# Patient Record
Sex: Male | Born: 1950 | Race: White | Hispanic: No | Marital: Married | Smoking: Never smoker
Health system: Southern US, Community
[De-identification: ages and names within clinical notes are randomized; demographics above are authoritative.]

## PROBLEM LIST (undated history)

## (undated) DIAGNOSIS — I10 Essential (primary) hypertension: Secondary | ICD-10-CM

## (undated) HISTORY — PX: NECK SURGERY: SHX720

---

## 2016-02-13 ENCOUNTER — Ambulatory Visit: Payer: Medicare Other | Admitting: Occupational Therapy

## 2016-02-13 NOTE — Therapy (Signed)
[facility] [address] [place], [place], [address] Phone: [phone]   Fax:  [phone]  Occupational Therapy Treatment  Patient Details  Name: Clement SayresJoseph Khouri Sr. MRN: [id number] Date of Birth: 01/02/1951 No Data Recorded  Encounter Date: 02/13/2016    No past medical history on file.  No past surgical history on file.  There were no vitals filed for this visit.     Pt report having about 10 months ago C6, C7 and T1 cervical surgery - and cont to have issues with 4th and 5th digits fatiguing and not able to extend them fully  - worse as the day go  Normal sensation - pt in need for night time splint to keep digit 5th and 4th fully extended  Didfabricate MC block splint for 5th to be able to extend PIP more with greeting people or reaching hand in narrow spaces  Ed on wearing and precautions -verbalize understaning   Pt did not had MD order - said his insurance do not require Therapist, nutritional- secretary did say several time that hospital policy do require - pt was setup as screen   Spend 50 min with pt                                  Patient will benefit from skilled therapeutic intervention in order to improve the following deficits and impairments:     Visit Diagnosis: Stiffness of right hand, not elsewhere classified    Problem List There are no active problems to display for this patient.   Oletta CohnuPreez, Nance Mccombs OTR/L,CLT  02/13/2016, 7:17 PM  [facility] [address] [place], [place], [address] Phone: [phone]   Fax:  [phone]  Name: Clement SayresJoseph Klemann Sr. MRN: [id number] Date of Birth: 02/02/1951

## 2018-11-29 ENCOUNTER — Emergency Department: Payer: Medicare Other

## 2018-11-29 ENCOUNTER — Inpatient Hospital Stay
Admission: EM | Admit: 2018-11-29 | Discharge: 2018-11-30 | DRG: 287 | Disposition: A | Discharge status: Other Acute Inpatient Hospital | Payer: Medicare Other | Attending: Internal Medicine | Admitting: Internal Medicine

## 2018-11-29 ENCOUNTER — Encounter: Payer: Self-pay | Admitting: Internal Medicine

## 2018-11-29 ENCOUNTER — Encounter
Admission: EM | Disposition: A | Discharge status: Other Acute Inpatient Hospital | Payer: Self-pay | Source: Home / Self Care | Attending: Internal Medicine

## 2018-11-29 ENCOUNTER — Inpatient Hospital Stay: Payer: Medicare Other

## 2018-11-29 ENCOUNTER — Other Ambulatory Visit: Payer: Self-pay

## 2018-11-29 ENCOUNTER — Inpatient Hospital Stay
Admit: 2018-11-29 | Discharge: 2018-11-29 | Disposition: A | Discharge status: Home / Self Care | Payer: Medicare Other | Attending: Internal Medicine | Admitting: Internal Medicine

## 2018-11-29 DIAGNOSIS — R001 Bradycardia, unspecified: Secondary | ICD-10-CM | POA: Diagnosis not present

## 2018-11-29 DIAGNOSIS — I10 Essential (primary) hypertension: Secondary | ICD-10-CM | POA: Diagnosis present

## 2018-11-29 DIAGNOSIS — I472 Ventricular tachycardia, unspecified: Secondary | ICD-10-CM

## 2018-11-29 DIAGNOSIS — R739 Hyperglycemia, unspecified: Secondary | ICD-10-CM | POA: Diagnosis present

## 2018-11-29 DIAGNOSIS — Z880 Allergy status to penicillin: Secondary | ICD-10-CM | POA: Diagnosis not present

## 2018-11-29 DIAGNOSIS — I213 ST elevation (STEMI) myocardial infarction of unspecified site: Secondary | ICD-10-CM | POA: Diagnosis present

## 2018-11-29 DIAGNOSIS — M109 Gout, unspecified: Secondary | ICD-10-CM | POA: Diagnosis present

## 2018-11-29 DIAGNOSIS — E860 Dehydration: Secondary | ICD-10-CM | POA: Diagnosis present

## 2018-11-29 DIAGNOSIS — G4733 Obstructive sleep apnea (adult) (pediatric): Secondary | ICD-10-CM | POA: Diagnosis present

## 2018-11-29 DIAGNOSIS — R55 Syncope and collapse: Secondary | ICD-10-CM | POA: Diagnosis present

## 2018-11-29 DIAGNOSIS — R7989 Other specified abnormal findings of blood chemistry: Secondary | ICD-10-CM | POA: Diagnosis present

## 2018-11-29 DIAGNOSIS — J4530 Mild persistent asthma, uncomplicated: Secondary | ICD-10-CM | POA: Diagnosis present

## 2018-11-29 DIAGNOSIS — Z7951 Long term (current) use of inhaled steroids: Secondary | ICD-10-CM

## 2018-11-29 DIAGNOSIS — Z79899 Other long term (current) drug therapy: Secondary | ICD-10-CM | POA: Diagnosis not present

## 2018-11-29 HISTORY — DX: Essential (primary) hypertension: I10

## 2018-11-29 HISTORY — PX: LEFT HEART CATH AND CORONARY ANGIOGRAPHY: CATH118249

## 2018-11-29 LAB — COMPREHENSIVE METABOLIC PANEL
ALT: 19 U/L (ref 0–44)
AST: 29 U/L (ref 15–41)
Albumin: 3.8 g/dL (ref 3.5–5.0)
Alkaline Phosphatase: 73 U/L (ref 38–126)
Anion gap: 15 (ref 5–15)
BUN: 24 mg/dL — ABNORMAL HIGH (ref 8–23)
CO2: 17 mmol/L — ABNORMAL LOW (ref 22–32)
Calcium: 9 mg/dL (ref 8.9–10.3)
Chloride: 106 mmol/L (ref 98–111)
Creatinine, Ser: 1.63 mg/dL — ABNORMAL HIGH (ref 0.61–1.24)
GFR calc Af Amer: 49 mL/min — ABNORMAL LOW (ref >60)
GFR calc non Af Amer: 42 mL/min — ABNORMAL LOW (ref >60)
Glucose, Bld: 276 mg/dL — ABNORMAL HIGH (ref 70–99)
Potassium: 4.2 mmol/L (ref 3.5–5.1)
Sodium: 138 mmol/L (ref 135–145)
Total Bilirubin: 0.9 mg/dL (ref 0.3–1.2)
Total Protein: 7.2 g/dL (ref 6.5–8.1)

## 2018-11-29 LAB — MRSA PCR SCREENING: MRSA by PCR: NEGATIVE

## 2018-11-29 LAB — CBC WITH DIFFERENTIAL/PLATELET
Abs Immature Granulocytes: 0.12 10*3/uL — ABNORMAL HIGH (ref 0.00–0.07)
Basophils Absolute: 0.2 10*3/uL — ABNORMAL HIGH (ref 0.0–0.1)
Basophils Relative: 1 %
Eosinophils Absolute: 0.7 10*3/uL — ABNORMAL HIGH (ref 0.0–0.5)
Eosinophils Relative: 4 %
HCT: 41.4 % (ref 39.0–52.0)
Hemoglobin: 13.6 g/dL (ref 13.0–17.0)
Immature Granulocytes: 1 %
Lymphocytes Relative: 24 %
Lymphs Abs: 4.3 10*3/uL — ABNORMAL HIGH (ref 0.7–4.0)
MCH: 30.9 pg (ref 26.0–34.0)
MCHC: 32.9 g/dL (ref 30.0–36.0)
MCV: 94.1 fL (ref 80.0–100.0)
Monocytes Absolute: 1.2 10*3/uL — ABNORMAL HIGH (ref 0.1–1.0)
Monocytes Relative: 6 %
Neutro Abs: 11.7 10*3/uL — ABNORMAL HIGH (ref 1.7–7.7)
Neutrophils Relative %: 64 %
Platelets: 330 10*3/uL (ref 150–400)
RBC: 4.4 MIL/uL (ref 4.22–5.81)
RDW: 14.4 % (ref 11.5–15.5)
WBC: 18.2 10*3/uL — ABNORMAL HIGH (ref 4.0–10.5)
nRBC: 0 % (ref 0.0–0.2)

## 2018-11-29 LAB — MAGNESIUM: Magnesium: 1.5 mg/dL — ABNORMAL LOW (ref 1.7–2.4)

## 2018-11-29 LAB — TROPONIN I
Troponin I: <0.03 ng/mL (ref <0.03)
Troponin I: 0.68 ng/mL (ref <0.03)
Troponin I: 10.33 ng/mL (ref <0.03)

## 2018-11-29 LAB — LIPASE, BLOOD: Lipase: 37 U/L (ref 11–51)

## 2018-11-29 LAB — APTT: aPTT: 65 seconds — ABNORMAL HIGH (ref 24–36)

## 2018-11-29 LAB — T4, FREE: Free T4: 0.93 ng/dL (ref 0.82–1.77)

## 2018-11-29 LAB — GLUCOSE, CAPILLARY: Glucose-Capillary: 136 mg/dL — ABNORMAL HIGH (ref 70–99)

## 2018-11-29 LAB — ETHANOL: Alcohol, Ethyl (B): <10 mg/dL (ref <10)

## 2018-11-29 LAB — PROTIME-INR
INR: 1.1 (ref 0.8–1.2)
Prothrombin Time: 14.3 seconds (ref 11.4–15.2)

## 2018-11-29 LAB — TSH: TSH: 5.014 u[IU]/mL — ABNORMAL HIGH (ref 0.350–4.500)

## 2018-11-29 SURGERY — LEFT HEART CATH AND CORONARY ANGIOGRAPHY
Anesthesia: Moderate Sedation

## 2018-11-29 MED ORDER — AMIODARONE HCL IN DEXTROSE 360-4.14 MG/200ML-% IV SOLN: 60.0000 mg/h | INTRAVENOUS | Status: DC

## 2018-11-29 MED ORDER — HYDROCODONE-ACETAMINOPHEN 5-325 MG PO TABS: 1.0000 | ORAL_TABLET | ORAL | Status: DC | PRN

## 2018-11-29 MED ORDER — FENTANYL CITRATE (PF) 100 MCG/2ML IJ SOLN
INTRAMUSCULAR | Status: AC
  Filled 2018-11-29: qty 2

## 2018-11-29 MED ORDER — ATROPINE SULFATE 1 MG/10ML IJ SOSY
0.5000 mg | PREFILLED_SYRINGE | Freq: Once | INTRAMUSCULAR | Status: AC
  Administered 2018-11-29: 0.5 mg via INTRAVENOUS

## 2018-11-29 MED ORDER — AMIODARONE HCL IN DEXTROSE 360-4.14 MG/200ML-% IV SOLN
60.0000 mg/h | INTRAVENOUS | Status: DC
  Administered 2018-11-29: 60 mg/h via INTRAVENOUS

## 2018-11-29 MED ORDER — HEPARIN (PORCINE) IN NACL 1000-0.9 UT/500ML-% IV SOLN
INTRAVENOUS | Status: DC | PRN
  Administered 2018-11-29: 1000 mL

## 2018-11-29 MED ORDER — ASPIRIN 81 MG PO CHEW
81.0000 mg | CHEWABLE_TABLET | Freq: Every day | ORAL | Status: DC
  Administered 2018-11-30: 81 mg via ORAL
  Filled 2018-11-29: qty 1

## 2018-11-29 MED ORDER — ONDANSETRON HCL 4 MG/2ML IJ SOLN: 4.0000 mg | Freq: Four times a day (QID) | INTRAMUSCULAR | Status: DC | PRN

## 2018-11-29 MED ORDER — HYDRALAZINE HCL 20 MG/ML IJ SOLN: 10.0000 mg | INTRAMUSCULAR | Status: DC | PRN

## 2018-11-29 MED ORDER — ACETAMINOPHEN 325 MG PO TABS: 650.0000 mg | ORAL_TABLET | Freq: Four times a day (QID) | ORAL | Status: DC | PRN

## 2018-11-29 MED ORDER — ALLOPURINOL 100 MG PO TABS
100.0000 mg | ORAL_TABLET | Freq: Every day | ORAL | Status: DC
  Filled 2018-11-29: qty 1

## 2018-11-29 MED ORDER — AMIODARONE LOAD VIA INFUSION
150.0000 mg | Freq: Once | INTRAVENOUS | Status: AC
  Administered 2018-11-29: 15:00:00 150 mg via INTRAVENOUS
  Filled 2018-11-29: qty 83.34

## 2018-11-29 MED ORDER — GABAPENTIN 300 MG PO CAPS
300.0000 mg | ORAL_CAPSULE | Freq: Two times a day (BID) | ORAL | Status: DC
  Administered 2018-11-29 – 2018-11-30 (×2): 300 mg via ORAL
  Filled 2018-11-29 (×2): qty 1

## 2018-11-29 MED ORDER — HYDROCHLOROTHIAZIDE 12.5 MG PO CAPS
12.5000 mg | ORAL_CAPSULE | Freq: Every day | ORAL | Status: DC
  Administered 2018-11-30: 12.5 mg via ORAL
  Filled 2018-11-29: qty 1

## 2018-11-29 MED ORDER — MAGNESIUM SULFATE 2 GM/50ML IV SOLN
2.0000 g | Freq: Once | INTRAVENOUS | Status: AC
  Administered 2018-11-29: 2 g via INTRAVENOUS
  Filled 2018-11-29: qty 50

## 2018-11-29 MED ORDER — TICAGRELOR 90 MG PO TABS
180.0000 mg | ORAL_TABLET | Freq: Once | ORAL | Status: DC
  Filled 2018-11-29: qty 2

## 2018-11-29 MED ORDER — AMIODARONE HCL IN DEXTROSE 360-4.14 MG/200ML-% IV SOLN
INTRAVENOUS | Status: AC
  Filled 2018-11-29: qty 200

## 2018-11-29 MED ORDER — SODIUM CHLORIDE 0.9 % IV SOLN: INTRAVENOUS | Status: DC

## 2018-11-29 MED ORDER — ACETAMINOPHEN 650 MG RE SUPP: 650.0000 mg | Freq: Four times a day (QID) | RECTAL | Status: DC | PRN

## 2018-11-29 MED ORDER — HEPARIN (PORCINE) 25000 UT/250ML-% IV SOLN: 1500.0000 [IU]/h | INTRAVENOUS | Status: DC

## 2018-11-29 MED ORDER — ASPIRIN 81 MG PO CHEW
CHEWABLE_TABLET | ORAL | Status: AC
  Filled 2018-11-29: qty 4

## 2018-11-29 MED ORDER — ATORVASTATIN CALCIUM 10 MG PO TABS
10.0000 mg | ORAL_TABLET | Freq: Every day | ORAL | Status: DC
  Administered 2018-11-30: 10:00:00 10 mg via ORAL
  Filled 2018-11-29 (×2): qty 1

## 2018-11-29 MED ORDER — ASPIRIN 81 MG PO CHEW
324.0000 mg | CHEWABLE_TABLET | Freq: Once | ORAL | Status: AC
  Administered 2018-11-29: 324 mg via ORAL

## 2018-11-29 MED ORDER — ACETAMINOPHEN 325 MG PO TABS: 650.0000 mg | ORAL_TABLET | ORAL | Status: DC | PRN

## 2018-11-29 MED ORDER — PERFLUTREN LIPID MICROSPHERE
1.0000 mL | INTRAVENOUS | Status: AC | PRN
  Administered 2018-11-29: 19:00:00 2 mL via INTRAVENOUS
  Filled 2018-11-29: qty 10

## 2018-11-29 MED ORDER — AMIODARONE HCL IN DEXTROSE 360-4.14 MG/200ML-% IV SOLN: 30.0000 mg/h | INTRAVENOUS | Status: DC

## 2018-11-29 MED ORDER — AMIODARONE IV BOLUS ONLY 150 MG/100ML
INTRAVENOUS | Status: AC
  Filled 2018-11-29: qty 100

## 2018-11-29 MED ORDER — LORAZEPAM 2 MG/ML IJ SOLN
2.0000 mg | Freq: Once | INTRAMUSCULAR | Status: AC
  Administered 2018-11-29: 2 mg via INTRAVENOUS

## 2018-11-29 MED ORDER — SODIUM CHLORIDE 0.9 % WEIGHT BASED INFUSION: 1.0000 mL/kg/h | INTRAVENOUS | Status: DC

## 2018-11-29 MED ORDER — SODIUM CHLORIDE 0.9 % IV SOLN
INTRAVENOUS | Status: AC | PRN
  Administered 2018-11-29: 250 mL via INTRAVENOUS

## 2018-11-29 MED ORDER — SENNOSIDES-DOCUSATE SODIUM 8.6-50 MG PO TABS: 1.0000 | ORAL_TABLET | Freq: Every evening | ORAL | Status: DC | PRN

## 2018-11-29 MED ORDER — LORAZEPAM 2 MG/ML IJ SOLN
INTRAMUSCULAR | Status: AC
  Filled 2018-11-29: qty 1

## 2018-11-29 MED ORDER — SODIUM CHLORIDE 0.9% FLUSH
3.0000 mL | Freq: Two times a day (BID) | INTRAVENOUS | Status: DC
  Administered 2018-11-30: 3 mL via INTRAVENOUS

## 2018-11-29 MED ORDER — LISINOPRIL-HYDROCHLOROTHIAZIDE 20-12.5 MG PO TABS: 1.0000 | ORAL_TABLET | Freq: Every day | ORAL | Status: DC

## 2018-11-29 MED ORDER — SODIUM CHLORIDE 0.9% FLUSH: 3.0000 mL | INTRAVENOUS | Status: DC | PRN

## 2018-11-29 MED ORDER — ALLOPURINOL 300 MG PO TABS: 300.0000 mg | ORAL_TABLET | Freq: Every day | ORAL | Status: DC

## 2018-11-29 MED ORDER — ONDANSETRON HCL 4 MG PO TABS: 4.0000 mg | ORAL_TABLET | Freq: Four times a day (QID) | ORAL | Status: DC | PRN

## 2018-11-29 MED ORDER — COLCHICINE 0.6 MG PO TABS
0.6000 mg | ORAL_TABLET | ORAL | Status: DC | PRN
  Filled 2018-11-29: qty 2

## 2018-11-29 MED ORDER — HEPARIN SODIUM (PORCINE) 5000 UNIT/ML IJ SOLN
4000.0000 [IU] | Freq: Once | INTRAMUSCULAR | Status: AC
  Administered 2018-11-29: 4000 [IU] via INTRAVENOUS
  Filled 2018-11-29: qty 1

## 2018-11-29 MED ORDER — MORPHINE SULFATE (PF) 2 MG/ML IV SOLN: 2.0000 mg | INTRAVENOUS | Status: DC | PRN

## 2018-11-29 MED ORDER — METOPROLOL TARTRATE 25 MG PO TABS
25.0000 mg | ORAL_TABLET | Freq: Two times a day (BID) | ORAL | Status: DC
  Administered 2018-11-29 – 2018-11-30 (×2): 25 mg via ORAL
  Filled 2018-11-29 (×2): qty 1

## 2018-11-29 MED ORDER — LABETALOL HCL 5 MG/ML IV SOLN: 10.0000 mg | INTRAVENOUS | Status: DC | PRN

## 2018-11-29 MED ORDER — ALLOPURINOL 300 MG PO TABS
300.0000 mg | ORAL_TABLET | Freq: Every day | ORAL | Status: DC
  Administered 2018-11-30: 10:00:00 300 mg via ORAL
  Filled 2018-11-29: qty 1

## 2018-11-29 MED ORDER — ENOXAPARIN SODIUM 40 MG/0.4ML ~~LOC~~ SOLN
40.0000 mg | SUBCUTANEOUS | Status: DC
  Administered 2018-11-29: 40 mg via SUBCUTANEOUS
  Filled 2018-11-29: qty 0.4

## 2018-11-29 MED ORDER — LISINOPRIL 20 MG PO TABS
20.0000 mg | ORAL_TABLET | Freq: Every day | ORAL | Status: DC
  Administered 2018-11-30: 20 mg via ORAL
  Filled 2018-11-29: qty 1

## 2018-11-29 MED ORDER — SODIUM CHLORIDE 0.9 % IV SOLN: 250.0000 mL | INTRAVENOUS | Status: DC | PRN

## 2018-11-29 SURGICAL SUPPLY — 11 items
CATH INFINITI 5FR ANG PIGTAIL (CATHETERS) ×2 IMPLANT
CATH INFINITI 5FR JL4 (CATHETERS) ×2 IMPLANT
CATH INFINITI JR4 5F (CATHETERS) ×2 IMPLANT
CATHETER LAUNCHER 6FR JR4 SH (CATHETERS) ×2 IMPLANT
DEVICE CLOSURE MYNXGRIP 6/7F (Vascular Products) ×2 IMPLANT
DEVICE INFLAT 30 PLUS (MISCELLANEOUS) ×2 IMPLANT
KIT MANI 3VAL PERCEP (MISCELLANEOUS) ×2 IMPLANT
NEEDLE PERC 18GX7CM (NEEDLE) ×2 IMPLANT
PACK CARDIAC CATH (CUSTOM PROCEDURE TRAY) ×2 IMPLANT
SHEATH AVANTI 6FR X 11CM (SHEATH) ×2 IMPLANT
WIRE GUIDERIGHT .035X150 (WIRE) ×2 IMPLANT

## 2018-11-29 NOTE — ED Notes (Signed)
Patient transported to cath lab

## 2018-11-29 NOTE — ED Triage Notes (Addendum)
Patient from home via [facility]. Patient had sudden onset of CP and dizziness with +LOC this afternoon. States he was doing yard work when episode occurred. Patient in sustained vtach with EMS. Given 150 mg amioderone on scene and started on 150 mg amioderone infusion en route. Patient remains in vtach upon arrival. Patient alert and oriented. Denies dizziness at this time time. CP remains 5/10. Patient on 15L nonrebreather

## 2018-11-29 NOTE — Consult Note (Signed)
Cardiology Consultation Note    Patient ID: Shane OchsJoseph Teigen Sr., MRN: [id number], DOB/AGE: 70/02/1951 70 y.o. Admit date: 11/29/2018   Date of Consult: 11/29/2018 Primary Physician: Patient, No Pcp Per Primary Cardiologist: Dr. Lady GaryFath  Chief Complaint: syncope Reason for Consultation: wide complex tachycardia Requesting MD: Dr. Tobi BastosPyreddy  HPI: Shane SayresJoseph Savage Sr. is a 68 y.o. male with history of hypertension but no other cardiac problems who was working outside today with the suddenly felt extremely lightheaded and passed out striking his head.  EMS was called on arrival was noted to be in a wide-complex tachycardia.  He was brought to the emergency room where he was given IV amiodarone without conversion.  He became hypotensive and was cardioverted back to sinus rhythm.  Electrocardiogram was abnormal and he underwent left heart cath which revealed insignificant coronary disease with normal LV function.  Currently he is asymptomatic and hemodynamically stable in sinus rhythm.  Initial troponin was 0.68.  He has no ischemic changes on his electrocardiogram.  He denies ethanol use.  He denies illicit drug use.  He is currently on allopurinol, atorvastatin, gabapentin, lisinopril-hydrochlorothiazide and oxycodone.  He is also on colchicine.  He denies any syncope.  He was somewhat dehydrated today.  Past Medical History:  Diagnosis Date  . Hypertension       Surgical History:  Past Surgical History:  Procedure Laterality Date  . NECK SURGERY       Home Meds: Prior to Admission medications   Medication Sig Start Date End Date Taking? Authorizing Provider  allopurinol (ZYLOPRIM) 100 MG tablet Take 100 mg by mouth daily. Take one 100 mg tablet by mouth daily along with 300 mg tablet 07/19/17  Yes [provider]  allopurinol (ZYLOPRIM) 300 MG tablet Take 300 mg by mouth daily. Take one 300 mg tablet by mouth daily along with 100 mg tablet 11/07/18  Yes [provider]   atorvastatin (LIPITOR) 10 MG tablet Take 10 mg by mouth daily. 10/19/18  Yes [provider]  gabapentin (NEURONTIN) 300 MG capsule Take 300 mg by mouth 2 (two) times daily. 08/15/18  Yes [provider]  lisinopril-hydrochlorothiazide (PRINZIDE,ZESTORETIC) 20-12.5 MG tablet Take 1 tablet by mouth daily. 10/01/18  Yes [provider]  naloxone (NARCAN) nasal spray 4 mg/0.1 mL Place 1 spray into the nose once. 12/07/17  Yes [provider]  oxyCODONE-acetaminophen (PERCOCET) 10-325 MG tablet Take 1 tablet by mouth every 4 (four) hours as needed for pain. Maximum of 6 tablets daily 08/20/18  Yes [provider]  colchicine 0.6 MG tablet Take 0.6-1.2 mg by mouth as directed. Take 2 tablets by mouth at onset of pain, then take one tablet one hour later, then one tablet twice daily 09/12/18   [provider]    Inpatient Medications:  . [START ON 11/30/2018] allopurinol  300 mg Oral Daily   And  . [START ON 11/30/2018] allopurinol  100 mg Oral Daily  . aspirin      . [START ON 11/30/2018] aspirin  81 mg Oral Daily  . atorvastatin  10 mg Oral Daily  . enoxaparin (LOVENOX) injection  40 mg Subcutaneous Q24H  . gabapentin  300 mg Oral BID  . [START ON 11/30/2018] hydrochlorothiazide  12.5 mg Oral Daily  . [START ON 11/30/2018] lisinopril  20 mg Oral Daily  . [START ON 11/30/2018] sodium chloride flush  3 mL Intravenous Q12H   . [START ON 11/30/2018] sodium chloride    . sodium chloride    .  sodium chloride    . amiodarone      Allergies:  Allergies  Allergen Reactions  . Penicillins Rash    Social History   Socioeconomic History  . Marital status: Married    Spouse name: Not on file  . Number of children: Not on file  . Years of education: Not on file  . Highest education level: Not on file  Occupational History  . Not on file  Social Needs  . Financial resource strain: Not on file  . Food insecurity:    Worry: Not on file    Inability:  Not on file  . Transportation needs:    Medical: Not on file    Non-medical: Not on file  Tobacco Use  . Smoking status: Never Smoker  . Smokeless tobacco: Never Used  Substance and Sexual Activity  . Alcohol use: Not Currently    Frequency: Never  . Drug use: Never  . Sexual activity: Not on file  Lifestyle  . Physical activity:    Days per week: 7 days    Minutes per session: Not on file  . Stress: Not on file  Relationships  . Social connections:    Talks on phone: Not on file    Gets together: Not on file    Attends religious service: Not on file    Active member of club or organization: Not on file    Attends meetings of clubs or organizations: Not on file    Relationship status: Not on file  . Intimate partner violence:    Fear of current or ex partner: Not on file    Emotionally abused: Not on file    Physically abused: Not on file    Forced sexual activity: Not on file  Other Topics Concern  . Not on file  Social History Narrative   Lives at home with wife and son. 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     History reviewed. No pertinent family history.   Review of Systems: A 12-system review of systems was performed and is negative except as noted in the HPI.  Labs: Recent Labs    11/29/18 1519 11/29/18 1709  TROPONINI <0.03 0.68*   Lab Results  Component Value Date   WBC 18.2 (H) 11/29/2018   HGB 13.6 11/29/2018   HCT 41.4 11/29/2018   MCV 94.1 11/29/2018   PLT 330 11/29/2018    Recent Labs  Lab 11/29/18 1519  NA 138  K 4.2  CL 106  CO2 17*  BUN 24*  CREATININE 1.63*  CALCIUM 9.0  PROT 7.2  BILITOT 0.9  ALKPHOS 73  ALT 19  AST 29  GLUCOSE 276*   No results found for: CHOL, HDL, LDLCALC, TRIG No results found for: DDIMER  Radiology/Studies:  Ct Head Wo Contrast  Result Date: 11/29/2018 CLINICAL DATA:  Head trauma with loss of consciousness. EXAM: CT HEAD WITHOUT CONTRAST TECHNIQUE: Contiguous axial images were obtained  from the base of the skull through the vertex without intravenous contrast. COMPARISON:  None. FINDINGS: Brain: There is no mass, hemorrhage or extra-axial collection. The size and configuration of the ventricles and extra-axial CSF spaces are normal. There is hypoattenuation of the white matter, most commonly indicating chronic small vessel disease. Vascular: No abnormal hyperdensity of the major intracranial arteries or dural venous sinuses. No intracranial atherosclerosis. Skull: The visualized skull base, calvarium and extracranial soft tissues are normal. Sinuses/Orbits: No fluid levels or advanced mucosal thickening of the visualized  paranasal sinuses. No mastoid or middle ear effusion. The orbits are normal. IMPRESSION: 1. No acute intracranial abnormality. 2. Findings of chronic small vessel disease. Electronically Signed   By: Deatra Robinson M.D.   On: 11/29/2018 18:15   Dg Chest Portable 1 View  Result Date: 11/29/2018 CLINICAL DATA:  68 year old male with a history sudden onset chest pain and dizziness. V-tach EXAM: PORTABLE CHEST 1 VIEW COMPARISON:  None. FINDINGS: Cardiomediastinal silhouette borderline enlarged, likely associated with low lung volumes. Low lung volumes, accentuating the interstitial markings and central vasculature. No pneumothorax. No large confluent airspace disease, with coarsened interstitial markings. Blunting at the right costophrenic sulcus and left costophrenic sulcus. Defibrillator pads projecting over the chest. IMPRESSION: Low lung volumes with likely basilar atelectasis and chronic changes. Small bilateral pleural effusions not excluded. Defibrillator pads projecting over the left chest. Electronically Signed   By: Gilmer Mor D.O.   On: 11/29/2018 15:46    Wt Readings from Last 3 Encounters:  11/29/18 130.8 kg    EKG: Wide-complex tachycardia Currently in sinus rhythm.  Physical Exam:  Blood pressure 130/71, pulse 85, temperature 98.6 F (37 C),  temperature source Oral, resp. rate (!) 0, height 6' (1.829 m), weight 130.8 kg, SpO2 93 %. Body mass index is 39.11 kg/m. General: Well developed, well nourished, in no acute distress. Head: Normocephalic, atraumatic, sclera non-icteric, no xanthomas, nares are without discharge.  Neck: Negative for carotid bruits. JVD not elevated. Lungs: Clear bilaterally to auscultation without wheezes, rales, or rhonchi. Breathing is unlabored. Heart: RRR with S1 S2. No murmurs, rubs, or gallops appreciated. Abdomen: Soft, non-tender, non-distended with normoactive bowel sounds. No hepatomegaly. No rebound/guarding. No obvious abdominal masses. Msk:  Strength and tone appear normal for age. Extremities: No clubbing or cyanosis. No edema.  Distal pedal pulses are 2+ and equal bilaterally. Neuro: Alert and oriented X 3. No facial asymmetry. No focal deficit. Moves all extremities spontaneously. Psych:  Responds to questions appropriately with a normal affect.     Assessment and Plan  68 year old male with no cardiac history admitted after a wide-complex tachycardia syncopal episode.  Cardioverted back to sinus rhythm.  Cardiac cath revealed insignificant coronary disease.  Ejection fraction reported as normal.  Will repeat Pete EKG to see if QT interval is prolonged.  Will proceed with an echocardiogram to evaluate LV function.  Troponin elevated secondary to wide-complex tachycardia and cardioversion.  Does not appear to represent acute coronary syndrome given his negative cath.  We will continue to review his medication for evidence of long QT.  Will discuss with electrophysiology regarding further work-up and treatment therapy.  Will discontinue heparin and Brilinta.  Add a beta-blocker as pressure tolerates.  Signed, Dalia Heading MD 11/29/2018, 6:42 PM Pager: [phone]

## 2018-11-29 NOTE — H&P (Signed)
[facility] [place] [facility]   PATIENT NAME: Clement SayresJoseph Schue    MR#:  [id number]  DATE OF BIRTH:  01/09/1951  DATE OF ADMISSION:  11/29/2018  PRIMARY CARE PHYSICIAN: Patient, No Pcp Per   REQUESTING/REFERRING PHYSICIAN:   CHIEF COMPLAINT:   Chief Complaint  Patient presents with  . Tachycardia    HISTORY OF PRESENT ILLNESS: Clement SayresJoseph Rather  is a 68 y.o. male with a known history of hypertension presented to the emergency room for chest discomfort and passing out.  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  He was picked up by the EMS and and had runs of ventricular tachycardia and was cardioverted and received amiodarone IV bolus.  Status post cardioversion patient EKG showed a ST segment changes and code STEMI was called.  Cardiology consult was done and patient had cardiac catheterization.  Cardiac cath did not reveal any blocks and it was a clean cath.  Hospitalist service was consulted later on for admission.  PAST MEDICAL HISTORY:   Past Medical History:  Diagnosis Date  . Hypertension     PAST SURGICAL HISTORY: Neck surgery  SOCIAL HISTORY:  Social History   Tobacco Use  . Smoking status: Never Smoker  . Smokeless tobacco: Never Used  Substance Use Topics  . Alcohol use: Never    Frequency: Never    FAMILY HISTORY: Mother and father deceased  DRUG ALLERGIES:  Allergies  Allergen Reactions  . Penicillins Rash    REVIEW OF SYSTEMS:   CONSTITUTIONAL: No fever, fatigue or weakness.  EYES: No blurred or double vision.  EARS, NOSE, AND THROAT: No tinnitus or ear pain.  RESPIRATORY: No cough, shortness of breath, wheezing or hemoptysis.  CARDIOVASCULAR: Had chest pain,  No orthopnea, edema.  GASTROINTESTINAL: No nausea, vomiting, diarrhea or abdominal pain.  GENITOURINARY: No dysuria, hematuria.  ENDOCRINE: No polyuria, nocturia,  HEMATOLOGY: No anemia, easy bruising or  bleeding SKIN: No rash or lesion. MUSCULOSKELETAL: No joint pain or arthritis.   NEUROLOGIC: No tingling, numbness, weakness.  Passed out PSYCHIATRY: No anxiety or depression.   MEDICATIONS AT HOME:  Prior to Admission medications   Medication Sig Start Date End Date Taking? Authorizing Provider  allopurinol (ZYLOPRIM) 100 MG tablet Take 100 mg by mouth daily. Take one 100 mg tablet by mouth daily along with 300 mg tablet 07/19/17  Yes [provider]  allopurinol (ZYLOPRIM) 300 MG tablet Take 300 mg by mouth daily. Take one 300 mg tablet by mouth daily along with 100 mg tablet 11/07/18  Yes [provider]  atorvastatin (LIPITOR) 10 MG tablet Take 10 mg by mouth daily. 10/19/18  Yes [provider]  gabapentin (NEURONTIN) 300 MG capsule Take 300 mg by mouth 2 (two) times daily. 08/15/18  Yes [provider]  lisinopril-hydrochlorothiazide (PRINZIDE,ZESTORETIC) 20-12.5 MG tablet Take 1 tablet by mouth daily. 10/01/18  Yes [provider]  naloxone (NARCAN) nasal spray 4 mg/0.1 mL Place 1 spray into the nose once. 12/07/17  Yes [provider]  oxyCODONE-acetaminophen (PERCOCET) 10-325 MG tablet Take 1 tablet by mouth every 4 (four) hours as needed for pain. Maximum of 6 tablets daily 08/20/18  Yes [provider]  colchicine 0.6 MG tablet Take 0.6-1.2 mg by mouth as directed. Take 2 tablets by mouth at onset of pain, then take one tablet one hour later, then one tablet twice daily 09/12/18   [provider]  PHYSICAL EXAMINATION:   VITAL SIGNS: Blood pressure 118/75, pulse 88, temperature 97.8 F (36.6 C), temperature source Axillary, resp. rate 15, height 6' (1.829 m), weight 130.8 kg, SpO2 95 %.  GENERAL:  68 y.o.-year-old patient lying in the bed with no acute distress.  EYES: Pupils equal, round, reactive to light and accommodation. No scleral icterus. Extraocular muscles intact.  HEENT: Head atraumatic, normocephalic.  Oropharynx and nasopharynx clear.  NECK:  Supple, no jugular venous distention. No thyroid enlargement, no tenderness.  LUNGS: Normal breath sounds bilaterally, no wheezing, rales,rhonchi or crepitation. No use of accessory muscles of respiration.  CARDIOVASCULAR: S1, S2 normal. No murmurs, rubs, or gallops.  ABDOMEN: Soft, nontender, nondistended. Bowel sounds present. No organomegaly or mass.  EXTREMITIES: No pedal edema, cyanosis, or clubbing.  NEUROLOGIC: Cranial nerves II through XII are intact. Muscle strength 5/5 in all extremities. Sensation intact. Gait not checked.  PSYCHIATRIC: The patient is alert and oriented x 3.  SKIN: No obvious rash, lesion, or ulcer.   LABORATORY PANEL:   CBC Recent Labs  Lab 11/29/18 1519  WBC 18.2*  HGB 13.6  HCT 41.4  PLT 330  MCV 94.1  MCH 30.9  MCHC 32.9  RDW 14.4  LYMPHSABS 4.3*  MONOABS 1.2*  EOSABS 0.7*  BASOSABS 0.2*   ------------------------------------------------------------------------------------------------------------------  Chemistries  Recent Labs  Lab 11/29/18 1519  NA 138  K 4.2  CL 106  CO2 17*  GLUCOSE 276*  BUN 24*  CREATININE 1.63*  CALCIUM 9.0  MG 1.5*  AST 29  ALT 19  ALKPHOS 73  BILITOT 0.9   ------------------------------------------------------------------------------------------------------------------ estimated creatinine clearance is 59.8 mL/min (A) (by C-G formula based on SCr of 1.63 mg/dL (H)). ------------------------------------------------------------------------------------------------------------------ Recent Labs    11/29/18 1519  TSH 5.014*     Coagulation profile Recent Labs  Lab 11/29/18 1709  INR 1.1   ------------------------------------------------------------------------------------------------------------------- No results for input(s): DDIMER in the last 72  hours. -------------------------------------------------------------------------------------------------------------------  Cardiac Enzymes Recent Labs  Lab 11/29/18 1519 11/29/18 1709  TROPONINI <0.03 0.68*   ------------------------------------------------------------------------------------------------------------------ Invalid input(s): POCBNP  ---------------------------------------------------------------------------------------------------------------  Urinalysis No results found for: COLORURINE, APPEARANCEUR, LABSPEC, PHURINE, GLUCOSEU, HGBUR, BILIRUBINUR, KETONESUR, PROTEINUR, UROBILINOGEN, NITRITE, LEUKOCYTESUR   RADIOLOGY: Dg Chest Portable 1 View  Result Date: 11/29/2018 CLINICAL DATA:  68 year old male with a history sudden onset chest pain and dizziness. V-tach EXAM: PORTABLE CHEST 1 VIEW COMPARISON:  None. FINDINGS: Cardiomediastinal silhouette borderline enlarged, likely associated with low lung volumes. Low lung volumes, accentuating the interstitial markings and central vasculature. No pneumothorax. No large confluent airspace disease, with coarsened interstitial markings. Blunting at the right costophrenic sulcus and left costophrenic sulcus. Defibrillator pads projecting over the chest. IMPRESSION: Low lung volumes with likely basilar atelectasis and chronic changes. Small bilateral pleural effusions not excluded. Defibrillator pads projecting over the left chest. Electronically Signed   By: Gilmer Mor D.O.   On: 11/29/2018 15:46    EKG: Orders placed or performed during the hospital encounter of 11/29/18  . EKG 12-Lead  . EKG 12-Lead  . EKG 12-Lead    IMPRESSION AND PLAN: 68 year old male patient with a known history of hypertension presented to the emergency room for chest discomfort and passing out.  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    -STEMI Status post cardiac cath Cardiology  follow-up Continue aspirin Cardiac catheterization revealed no obstruction  -Ventricular tachycardia Currently in sinus rhythm Received amiodarone bolus Will monitor patient on telemetry  -Syncope Probably vasovagal Check  CT head and carotid ultrasound Telemetry monitoring  -Leukocytosis Stress-induced Follow-up WBC count  -DVT prophylaxis subcu Lovenox daily  -Hypomagnesemia Replace magnesium intravenously  All the records are reviewed and case discussed with ED provider. Management plans discussed with the patient, family and they are in agreement.  CODE STATUS:Full code    Code Status Orders  (From admission, onward)         Start     Ordered   11/29/18 1721  Full code  Continuous     11/29/18 1720        Code Status History    Date Active Date Inactive Code Status Order ID Comments User Context   11/29/2018 1649 11/29/2018 1720 Full Code [id number]  Alwyn Pea, MD Inpatient      TOTAL TIME TAKING CARE OF THIS PATIENT: 54 minutes.    Ihor Austin M.D on 11/29/2018 at 5:48 PM  Between 7am to 6pm - Pager - [phone]  After 6pm go to [email or web address] - password [email or web address]  [facility]  Office  [phone]  CC: Primary care physician; Patient, No Pcp Per

## 2018-11-29 NOTE — ED Notes (Signed)
Patient cardioverted with synchronized defib at 200 j. Patient HR is sinus brady at 35 after defib.

## 2018-11-29 NOTE — ED Notes (Signed)
Dr. Juliann Pares at bedside with patient.

## 2018-11-29 NOTE — Progress Notes (Signed)
Advanced care plan. Purpose of the Encounter: CODE STATUS Parties in Attendance:Patient Patient's Decision Capacity:Ok Subjective/Patient's story: Shane Savage  is a 68 y.o. male with a known history of hypertension presented to the emergency room for chest discomfort and passing out.  Objective/Medical story Patient was in ventricular tachycardia.  Was cardioverted.  EKG showed ST segment changes needs cardiology evaluation and possible cardiac cath.  Needs further evaluation of syncope. Goals of care determination:  Advance care directives goals of care treatment plan discussed Patient wants everything done which includes CPR, intubation ventilator if the need arises CODE STATUS: Full code Time spent discussing advanced care planning: 16 minutes

## 2018-11-29 NOTE — ED Provider Notes (Signed)
[facility] Emergency Department Provider Note  ____________________________________________  Time seen: Approximately 4:16 PM  I have reviewed the triage vital signs and the nursing notes.   HISTORY  Chief Complaint Tachycardia    HPI Shane Plog Sr. is a 68 y.o. male with a past history of hypertension and gout who was brought to the ED by EMS due to chest pain and passing out.  He was in his usual state of health today, was working outside and shortly after 2:00 PM he had sudden onset of chest pain and syncope.  Also reports the pain had radiated to his arm.  EMS note that he appeared to be in ventricular tachycardia so he was initially given a bolus of 150 mg of amiodarone on scene, and EMS gave an additional 20 mg of amnio via infusion during transport.  Patient reports chest pain is currently 4/10, dull, constant, no aggravating or alleviating factors.  Also has shortness of breath and diaphoresis.  No vomiting.      Past medical history includes hypertension and gout   There are no active problems to display for this patient.       Prior to Admission medications   Not on File  Colchicine Allopurinol Lisinopril Hydrochlorothiazide   Allergies Penicillins   No family history on file.  Social History Social History   Tobacco Use  . Smoking status: Not on file  Substance Use Topics  . Alcohol use: Not on file  . Drug use: Not on file  No tobacco alcohol or drug use  Review of Systems  Constitutional:   No fever or chills.  ENT:   No sore throat. No rhinorrhea. Cardiovascular: Positive as above for chest pain and syncope. Respiratory:   No dyspnea or cough. Gastrointestinal:   Negative for abdominal pain, vomiting and diarrhea.  Musculoskeletal:   Negative for focal pain or swelling All other systems reviewed and are negative except as documented above in ROS and  HPI.  ____________________________________________   PHYSICAL EXAM:  VITAL SIGNS: ED Triage Vitals  Enc Vitals Group     BP 11/29/18 1522 (!) 70/51     Pulse Rate 11/29/18 1522 (!) 112     Resp 11/29/18 1522 17     Temp 11/29/18 1551 98 F (36.7 C)     Temp Source 11/29/18 1551 Oral     SpO2 11/29/18 1522 99 %     Weight 11/29/18 1536 (!) 330 lb (149.7 kg)     Height 11/29/18 1536 6' (1.829 m)     Head Circumference --      Peak Flow --      Pain Score 11/29/18 1510 5     Pain Loc --      Pain Edu? --      Excl. in GC? --     Vital signs reviewed, nursing assessments reviewed.   Constitutional:   Alert and oriented.  Somewhat ill-appearing Eyes:   Conjunctivae are normal. EOMI. PERRL. ENT      Head:   Normocephalic and atraumatic.      Nose:   No congestion/rhinnorhea.       Mouth/Throat:   Dry mucous membranes, no pharyngeal erythema. No peritonsillar mass.       Neck:   No meningismus. Full ROM. Hematological/Lymphatic/Immunilogical:   No cervical lymphadenopathy. Cardiovascular:   Tachycardia, heart rate 230.  Monomorphic on monitor.. Symmetric bilateral radial and DP pulses.  No murmurs. Cap refill less than 2 seconds. Respiratory:  Normal respiratory effort without tachypnea/retractions. Breath sounds are clear and equal bilaterally. No wheezes/rales/rhonchi. Gastrointestinal:   Soft and nontender. Non distended. There is no CVA tenderness.  No rebound, rigidity, or guarding.  Musculoskeletal:   Normal range of motion in all extremities. No joint effusions.  No lower extremity tenderness.  No edema. Neurologic:   Normal speech and language.  Motor grossly intact. No acute focal neurologic deficits are appreciated.  Skin:    Skin is warm, dry with small superficial skin abrasion on left forearm approximately 3 cm big.  No laceration. No rash noted.  No petechiae, purpura, or bullae.  ____________________________________________    LABS (pertinent  positives/negatives) (all labs ordered are listed, but only abnormal results are displayed) Labs Reviewed  COMPREHENSIVE METABOLIC PANEL - Abnormal; Notable for the following components:      Result Value   CO2 17 (*)    Glucose, Bld 276 (*)    BUN 24 (*)    Creatinine, Ser 1.63 (*)    GFR calc non Af Amer 42 (*)    GFR calc Af Amer 49 (*)    All other components within normal limits  CBC WITH DIFFERENTIAL/PLATELET - Abnormal; Notable for the following components:   WBC 18.2 (*)    Neutro Abs 11.7 (*)    Lymphs Abs 4.3 (*)    Monocytes Absolute 1.2 (*)    Eosinophils Absolute 0.7 (*)    Basophils Absolute 0.2 (*)    Abs Immature Granulocytes 0.12 (*)    All other components within normal limits  MAGNESIUM - Abnormal; Notable for the following components:   Magnesium 1.5 (*)    All other components within normal limits  TSH - Abnormal; Notable for the following components:   TSH 5.014 (*)    All other components within normal limits  ETHANOL  LIPASE, BLOOD  TROPONIN I  T4, FREE  URINALYSIS, COMPLETE (UACMP) WITH MICROSCOPIC  APTT  PROTIME-INR   ____________________________________________   EKG  Initial EKG interpreted by me Shows ventricular tachycardia, monomorphic, rate of approximately 230.  No discernible ST changes.  Normal axis.  Repeat EKG after cardioversion interpreted by me Sinus bradycardia with occasional ectopy, rate of 40.  Normal intervals ST segments and T waves.  Repeat EKG after atropine interpreted by me Sinus rhythm with ectopy, rate of approximately 80 Normal axis and intervals.  ST elevation in V1 and V2, deep ST depression in inferior leads as well as V3 through V6.  ____________________________________________    RADIOLOGY  Dg Chest Portable 1 View  Result Date: 11/29/2018 CLINICAL DATA:  68 year old male with a history sudden onset chest pain and dizziness. V-tach EXAM: PORTABLE CHEST 1 VIEW COMPARISON:  None. FINDINGS:  Cardiomediastinal silhouette borderline enlarged, likely associated with low lung volumes. Low lung volumes, accentuating the interstitial markings and central vasculature. No pneumothorax. No large confluent airspace disease, with coarsened interstitial markings. Blunting at the right costophrenic sulcus and left costophrenic sulcus. Defibrillator pads projecting over the chest. IMPRESSION: Low lung volumes with likely basilar atelectasis and chronic changes. Small bilateral pleural effusions not excluded. Defibrillator pads projecting over the left chest. Electronically Signed   By: Gilmer Mor D.O.   On: 11/29/2018 15:46    ____________________________________________   PROCEDURES .Critical Care Performed by: Sharman Cheek, MD Authorized by: Sharman Cheek, MD   Critical care provider statement:    Critical care time (minutes):  35   Critical care time was exclusive of:  Separately billable procedures and treating other  patients   Critical care was necessary to treat or prevent imminent or life-threatening deterioration of the following conditions:  Cardiac failure and circulatory failure   Critical care was time spent personally by me on the following activities:  Development of treatment plan with patient or surrogate, discussions with consultants, evaluation of patient's response to treatment, examination of patient, obtaining history from patient or surrogate, ordering and performing treatments and interventions, ordering and review of laboratory studies, ordering and review of radiographic studies, pulse oximetry, re-evaluation of patient's condition and review of old charts Comments:         .Cardioversion Date/Time: 11/29/2018 4:21 PM Performed by: Sharman Cheek, MD Authorized by: Sharman Cheek, MD   Pre-procedure details:    Cardioversion basis:  Emergent   Rhythm:  Ventricular tachycardia   Electrode placement:  Anterior-lateral Patient sedated: Yes. Refer to  sedation procedure documentation for details of sedation.  Attempt one:    Cardioversion mode:  Synchronous   Waveform:  Biphasic   Shock (Joules):  200   Shock outcome:  Conversion to normal sinus rhythm Post-procedure details:    Patient status:  Awake Comments:     Converted from monomorphic ventricular tachycardia to sinus bradycardia with improved but persistent hypotension.   .Sedation Date/Time: 11/29/2018 4:22 PM Performed by: Sharman Cheek, MD Authorized by: Sharman Cheek, MD   Consent:    Consent obtained:  Emergent situation (electronic informed consent)   Consent given by:  Patient   Risks discussed:  Inadequate sedation, nausea, vomiting, respiratory compromise necessitating ventilatory assistance and intubation, prolonged sedation necessitating reversal and prolonged hypoxia resulting in organ damage   Alternatives discussed:  Analgesia without sedation Universal protocol:    Procedure explained and questions answered to patient or proxy's satisfaction: yes     Required blood products, implants, devices, and special equipment available: yes     Immediately prior to procedure a time out was called: yes     Patient identity confirmation method:  Arm band Indications:    Procedure performed:  Cardioversion   Procedure necessitating sedation performed by:  Physician performing sedation Pre-sedation assessment:    Time since last food or drink:  Unknown   NPO status caution: unable to specify NPO status and urgency dictates proceeding with non-ideal NPO status     ASA classification: class 2 - patient with mild systemic disease     Neck mobility: normal     Mouth opening:  3 or more finger widths   Thyromental distance:  4 finger widths   Mallampati score:  I - soft palate, uvula, fauces, pillars visible   Pre-sedation assessments completed and reviewed: airway patency, cardiovascular function, hydration status, mental status, nausea/vomiting, pain level,  respiratory function and temperature     Pre-sedation assessment completed:  11/29/2018 3:20 PM Immediate pre-procedure details:    Reassessment: Patient reassessed immediately prior to procedure     Reviewed: vital signs, relevant labs/tests and NPO status     Verified: bag valve mask available, emergency equipment available, intubation equipment available, IV patency confirmed, oxygen available, reversal medications available and suction available   Procedure details (see MAR for exact dosages):    Preoxygenation:  Nonrebreather mask   Sedation: lorazepam.   Analgesia:  None   Intra-procedure monitoring:  Blood pressure monitoring, continuous pulse oximetry, cardiac monitor, frequent vital sign checks and frequent LOC assessments   Intra-procedure events: none     Total Provider sedation time (minutes):  10 Post-procedure details:    Post-sedation assessment completed:  11/29/2018 3:55 PM   Attendance: Constant attendance by certified staff until patient recovered     Recovery: Patient returned to pre-procedure baseline     Post-sedation assessments completed and reviewed: airway patency, cardiovascular function, hydration status, mental status, nausea/vomiting, pain level and respiratory function     Patient is stable for discharge or admission: yes     Patient tolerance:  Tolerated well, no immediate complications    ____________________________________________    CLINICAL IMPRESSION / ASSESSMENT AND PLAN / ED COURSE  Medications ordered in the ED: Medications  amiodarone (NEXTERONE PREMIX) 360-4.14 MG/200ML-% (1.8 mg/mL) IV infusion (60 mg/hr Intravenous New Bag/Given 11/29/18 1521)  amiodarone (NEXTERONE) 150-4.21 MG/100ML-% bolus (  Not Given 11/29/18 1540)  amiodarone (NEXTERONE) 1.8 mg/mL load via infusion 150 mg (150 mg Intravenous Bolus from Bag 11/29/18 1521)    Followed by  amiodarone (NEXTERONE PREMIX) 360-4.14 MG/200ML-% (1.8 mg/mL) IV infusion (60 mg/hr Intravenous Not  Given 11/29/18 1539)    Followed by  amiodarone (NEXTERONE PREMIX) 360-4.14 MG/200ML-% (1.8 mg/mL) IV infusion (has no administration in time range)  ticagrelor (BRILINTA) tablet 180 mg ( Oral MAR Hold 11/29/18 1600)  aspirin 81 MG chewable tablet (has no administration in time range)  heparin ADULT infusion 100 units/mL (25000 units/2150mL sodium chloride 0.45%) (has no administration in time range)  LORazepam (ATIVAN) injection 2 mg (2 mg Intravenous Given 11/29/18 1525)  atropine 1 MG/10ML injection 0.5 mg (0.5 mg Intravenous Given 11/29/18 1529)  heparin injection 4,000 Units ( Intravenous MAR Hold 11/29/18 1600)  aspirin chewable tablet 324 mg (324 mg Oral Given 11/29/18 1553)    Pertinent labs & imaging results that were available during my care of the patient were reviewed by me and considered in my medical decision making (see chart for details).  Shane SayresJoseph Paugh Sr. was evaluated in Emergency Department on 11/29/2018 for the symptoms described in the history of present illness. He was evaluated in the context of the global COVID-19 pandemic, which necessitated consideration that the patient might be at risk for infection with the SARS-CoV-2 virus that causes COVID-19. Institutional protocols and algorithms that pertain to the evaluation of patients at risk for COVID-19 are in a state of rapid change based on information released by regulatory bodies including the CDC and federal and state organizations. These policies and algorithms were followed during the patient's care in the ED.   Patient presents with ventricular tachycardia.  Given amiodarone bolus prehospital, so plan to continue amiodarone with hope of rate and rhythm control.  Initial blood pressure was about 130/90, hemodynamically stable but needing constant monitoring due to risk of cardiovascular deterioration.  Clinical Course as of Nov 29 1615  Tue Nov 29, 2018  1538 After initiating amio in ED, pt developed hypotension, increased  diaphoresis.  Verbal consent for cardioversion, which was performed emergently and convered pt to sinus bradycardia. This was improved with 0.5mg  atropine. Repeat ekg now shows deep ST depression with v1 STE. Code stemi called.   [PS]    Clinical Course User Index [PS] Sharman CheekStafford, Merian Wroe, MD    ----------------------------------------- 4:26 PM on 11/29/2018 -----------------------------------------  Dr. Juliann Paresallwood arrived to the bedside right away, assessed patient, and recommended proceeding to emergent cardiac catheterization to which the patient agreed.  Patient given heparin aspirin and Brilinta per Dr. Glennis Brinkallwood's recommendation.  Patient's son updated per patient's request   ____________________________________________   FINAL CLINICAL IMPRESSION(S) / ED DIAGNOSES    Final diagnoses:  Ventricular tachycardia (HCC)  ST elevation myocardial infarction (STEMI),  unspecified artery [facility])     ED Discharge Orders    None      Portions of this note were generated with dragon dictation software. Dictation errors may occur despite best attempts at proofreading.   Sharman Cheek, MD 11/29/18 [id number]

## 2018-11-29 NOTE — ED Notes (Signed)
Patient reports resolution of CP after cardioversion. States he feels "95% normal". Breathing easily on 3L [place]

## 2018-11-29 NOTE — Consult Note (Signed)
CRITICAL CARE NOTE      CHIEF COMPLAINT:   Cardiac arrhythmia   HPI   This is a pleasant 68 year old male with a medical history of mild intermittent asthma, sleep apnea, neuropathy, chronic pain syndrome, essential hypertension, cervical spinal fusion, prediabetes, he came to the ED with some chest discomfort but complained of having a syncopal episode.  He initially felt disequilibrium and then lost consciousness.  In the field EMS noted sustained V. tach and had received chemical cardioversion with IV amiodarone to sinus rhythm.  EMS noted some ST segment changes and called code STEMI.  Cardiology was immediately notified and had rushed patient to cardiac cath.  I discussed case with Dr. Lady Gary and he shared that coronaries were without any lesions and did not require PCI.  During my interview patient relates that he has no chest discomfort at this time.  Repeat EKG is pending.  Patient denies trying any new medications, illicit drug use or active alcohol abuse however does admit to previous alcohol use.  PAST MEDICAL HISTORY   Past Medical History:  Diagnosis Date  . Hypertension      SURGICAL HISTORY   Past Surgical History:  Procedure Laterality Date  . NECK SURGERY       FAMILY HISTORY   History reviewed. No pertinent family history.   SOCIAL HISTORY   Social History   Tobacco Use  . Smoking status: Never Smoker  . Smokeless tobacco: Never Used  Substance Use Topics  . Alcohol use: Not Currently    Frequency: Never  . Drug use: Never     MEDICATIONS   Current Medication:  Current Facility-Administered Medications:  .  [START ON 11/30/2018] 0.9 %  sodium chloride infusion, 250 mL, Intravenous, PRN, Callwood, Dwayne D, MD .  0.9 %  sodium chloride infusion, , Intravenous, Continuous,  Pyreddy, Pavan, MD .  0.9% sodium chloride infusion, 1 mL/kg/hr, Intravenous, Continuous, Callwood, Dwayne D, MD .  acetaminophen (TYLENOL) tablet 650 mg, 650 mg, Oral, Q6H PRN **OR** acetaminophen (TYLENOL) suppository 650 mg, 650 mg, Rectal, Q6H PRN, Pyreddy, Pavan, MD .  acetaminophen (TYLENOL) tablet 650 mg, 650 mg, Oral, Q4H PRN, Callwood, Dwayne D, MD .  Melene Muller ON 11/30/2018] allopurinol (ZYLOPRIM) tablet 300 mg, 300 mg, Oral, Daily **AND** [START ON 11/30/2018] allopurinol (ZYLOPRIM) tablet 100 mg, 100 mg, Oral, Daily, Pyreddy, Pavan, MD .  amiodarone (NEXTERONE) 150-4.21 MG/100ML-% bolus, , , ,  .  aspirin 81 MG chewable tablet, , , ,  .  [  START ON 11/30/2018] aspirin chewable tablet 81 mg, 81 mg, Oral, Daily, Callwood, Dwayne D, MD .  atorvastatin (LIPITOR) tablet 10 mg, 10 mg, Oral, Daily, Pyreddy, Pavan, MD .  colchicine tablet 0.6-1.2 mg, 0.6-1.2 mg, Oral, PRN, Pyreddy, Pavan, MD .  enoxaparin (LOVENOX) injection 40 mg, 40 mg, Subcutaneous, Q24H, Pyreddy, Pavan, MD .  gabapentin (NEURONTIN) capsule 300 mg, 300 mg, Oral, BID, Pyreddy, Pavan, MD .  Melene Muller[START ON 11/30/2018] hydrochlorothiazide (MICROZIDE) capsule 12.5 mg, 12.5 mg, Oral, Daily, Lowella BandyGrubb, Rodney D, RPH .  HYDROcodone-acetaminophen (NORCO/VICODIN) 5-325 MG per tablet 1-2 tablet, 1-2 tablet, Oral, Q4H PRN, Pyreddy, Pavan, MD .  Melene Muller[START ON 11/30/2018] lisinopril (PRINIVIL,ZESTRIL) tablet 20 mg, 20 mg, Oral, Daily, Lowella BandyGrubb, Rodney D, RPH .  morphine 2 MG/ML injection 2 mg, 2 mg, Intravenous, Q4H PRN, Pyreddy, Pavan, MD .  ondansetron (ZOFRAN) tablet 4 mg, 4 mg, Oral, Q6H PRN **OR** ondansetron (ZOFRAN) injection 4 mg, 4 mg, Intravenous, Q6H PRN, Pyreddy, Pavan, MD .  senna-docusate (Senokot-S) tablet 1 tablet, 1 tablet, Oral, QHS PRN, Ihor AustinPyreddy, Pavan, MD .  Melene Muller[START ON 11/30/2018] sodium chloride flush (NS) 0.9 % injection 3 mL, 3 mL, Intravenous, Q12H, Callwood, Dwayne D, MD .  Melene Muller[START ON 11/30/2018] sodium chloride flush (NS) 0.9 % injection 3  mL, 3 mL, Intravenous, PRN, Callwood, Dwayne D, MD    ALLERGIES   Penicillins    REVIEW OF SYSTEMS    10 point ROS is negative except as per HPI  PHYSICAL EXAMINATION   Vitals:   11/29/18 1700 11/29/18 1800  BP: 118/75 130/71  Pulse: 88 85  Resp: 15 (!) 0  Temp: 97.8 F (36.6 C) 98.6 F (37 C)  SpO2: 95% 93%    GENERAL: No apparent distress HEAD: Normocephalic, atraumatic.  EYES: Pupils equal, round, reactive to light.  No scleral icterus.  MOUTH: Moist mucosal membrane. NECK: Supple. No thyromegaly. No nodules. No JVD.  PULMONARY: Clear to auscultation bilaterally CARDIOVASCULAR: S1 and S2. Regular rate and rhythm. No murmurs, rubs, or gallops.  GASTROINTESTINAL: Soft, nontender, non-distended. No masses. Positive bowel sounds. No hepatosplenomegaly.  MUSCULOSKELETAL: No swelling, clubbing, or edema.  NEUROLOGIC: Mild distress due to acute illness SKIN:intact,warm,dry   LABS AND IMAGING      LAB RESULTS: Recent Labs  Lab 11/29/18 1519  NA 138  K 4.2  CL 106  CO2 17*  BUN 24*  CREATININE 1.63*  GLUCOSE 276*   Recent Labs  Lab 11/29/18 1519  HGB 13.6  HCT 41.4  WBC 18.2*  PLT 330     IMAGING RESULTS: Ct Head Wo Contrast  Result Date: 11/29/2018 CLINICAL DATA:  Head trauma with loss of consciousness. EXAM: CT HEAD WITHOUT CONTRAST TECHNIQUE: Contiguous axial images were obtained from the base of the skull through the vertex without intravenous contrast. COMPARISON:  None. FINDINGS: Brain: There is no mass, hemorrhage or extra-axial collection. The size and configuration of the ventricles and extra-axial CSF spaces are normal. There is hypoattenuation of the white matter, most commonly indicating chronic small vessel disease. Vascular: No abnormal hyperdensity of the major intracranial arteries or dural venous sinuses. No intracranial atherosclerosis. Skull: The visualized skull base, calvarium and extracranial soft tissues are normal. Sinuses/Orbits:  No fluid levels or advanced mucosal thickening of the visualized paranasal sinuses. No mastoid or middle ear effusion. The orbits are normal. IMPRESSION: 1. No acute intracranial abnormality. 2. Findings of chronic small vessel disease. Electronically Signed   By: Deatra RobinsonKevin  Herman M.D.   On: 11/29/2018 18:15   Dg  Chest Portable 1 View  Result Date: 11/29/2018 CLINICAL DATA:  68 year old male with a history sudden onset chest pain and dizziness. V-tach EXAM: PORTABLE CHEST 1 VIEW COMPARISON:  None. FINDINGS: Cardiomediastinal silhouette borderline enlarged, likely associated with low lung volumes. Low lung volumes, accentuating the interstitial markings and central vasculature. No pneumothorax. No large confluent airspace disease, with coarsened interstitial markings. Blunting at the right costophrenic sulcus and left costophrenic sulcus. Defibrillator pads projecting over the chest. IMPRESSION: Low lung volumes with likely basilar atelectasis and chronic changes. Small bilateral pleural effusions not excluded. Defibrillator pads projecting over the left chest. Electronically Signed   By: Gilmer Mor D.O.   On: 11/29/2018 15:46      ASSESSMENT AND PLAN     Syncope-due to arrhythmia-V. Tach  -Status post cardiology evaluation and left heart cath-appreciate input -Unclear etiology as of yet, will need to investigate electrolyte, drug, alcohol, illicit substance as possible etiology  -ICU telemetry monitoring -oxygen as needed -follow up cardiac enzymes as indicated   Renal Failure-most likely due to contrast load from [facility] -follow chem 7 -follow UO -continue Foley Catheter-assess need daily  Moderate OSA     - may restart home CPAP as perPSG   Mild persistant asthma    -eosinophilic endotype     - Pulmicort neb daily    -albuterol neb q8h prn    GI/Nutrition GI PROPHYLAXIS as indicated DIET-->TF's as tolerated Constipation protocol as indicated  ENDO-hyperglycemia - ICU  hypoglycemic\Hyperglycemia protocol -check FSBS per protocol   ELECTROLYTES -follow labs as needed -replace as needed -pharmacy consultation   DVT/GI PRX ordered -SCDs  TRANSFUSIONS AS NEEDED MONITOR FSBS ASSESS the need for LABS as needed   Critical care provider statement:    Critical care time (minutes):  36   Critical care time was exclusive of:  Separately billable procedures and treating other patients   Critical care was necessary to treat or prevent imminent or life-threatening deterioration of the following conditions:   Cardiac arrhythmia, syncope, acute kidney injury, hyperglycemia, multiple comorbid conditions   Critical care was time spent personally by me on the following activities:  Development of treatment plan with patient or surrogate, discussions with consultants, evaluation of patient's response to treatment, examination of patient, obtaining history from patient or surrogate, ordering and performing treatments and interventions, ordering and review of laboratory studies and re-evaluation of patient's condition.  I assumed direction of critical care for this patient from another provider in my specialty: no    This document was prepared using Dragon voice recognition software and may include unintentional dictation errors.    Vida Rigger, M.D.  Division of Pulmonary & Critical Care Medicine  [facility]

## 2018-11-30 ENCOUNTER — Encounter: Payer: Self-pay | Admitting: Internal Medicine

## 2018-11-30 ENCOUNTER — Inpatient Hospital Stay: Payer: Medicare Other

## 2018-11-30 LAB — CBC
HCT: 37.2 % — ABNORMAL LOW (ref 39.0–52.0)
Hemoglobin: 11.9 g/dL — ABNORMAL LOW (ref 13.0–17.0)
MCH: 30.4 pg (ref 26.0–34.0)
MCHC: 32 g/dL (ref 30.0–36.0)
MCV: 95.1 fL (ref 80.0–100.0)
Platelets: 256 10*3/uL (ref 150–400)
RBC: 3.91 MIL/uL — ABNORMAL LOW (ref 4.22–5.81)
RDW: 14.6 % (ref 11.5–15.5)
WBC: 11.1 10*3/uL — ABNORMAL HIGH (ref 4.0–10.5)
nRBC: 0 % (ref 0.0–0.2)

## 2018-11-30 LAB — URINALYSIS, COMPLETE (UACMP) WITH MICROSCOPIC
Bacteria, UA: NONE SEEN
Bilirubin Urine: NEGATIVE
Glucose, UA: NEGATIVE mg/dL
Hgb urine dipstick: NEGATIVE
Ketones, ur: NEGATIVE mg/dL
Leukocytes,Ua: NEGATIVE
Nitrite: NEGATIVE
Protein, ur: NEGATIVE mg/dL
Specific Gravity, Urine: 1.045 — ABNORMAL HIGH (ref 1.005–1.030)
Squamous Epithelial / HPF: NONE SEEN (ref 0–5)
pH: 5 (ref 5.0–8.0)

## 2018-11-30 LAB — BASIC METABOLIC PANEL
Anion gap: 8 (ref 5–15)
BUN: 26 mg/dL — ABNORMAL HIGH (ref 8–23)
CO2: 22 mmol/L (ref 22–32)
Calcium: 8.4 mg/dL — ABNORMAL LOW (ref 8.9–10.3)
Chloride: 107 mmol/L (ref 98–111)
Creatinine, Ser: 1.47 mg/dL — ABNORMAL HIGH (ref 0.61–1.24)
GFR calc Af Amer: 56 mL/min — ABNORMAL LOW (ref >60)
GFR calc non Af Amer: 48 mL/min — ABNORMAL LOW (ref >60)
Glucose, Bld: 110 mg/dL — ABNORMAL HIGH (ref 70–99)
Potassium: 4.5 mmol/L (ref 3.5–5.1)
Sodium: 137 mmol/L (ref 135–145)

## 2018-11-30 LAB — URINE DRUG SCREEN, QUALITATIVE (ARMC ONLY)
Amphetamines, Ur Screen: NOT DETECTED
Barbiturates, Ur Screen: NOT DETECTED
Benzodiazepine, Ur Scrn: POSITIVE — AB
Cannabinoid 50 Ng, Ur ~~LOC~~: NOT DETECTED
Cocaine Metabolite,Ur ~~LOC~~: NOT DETECTED
MDMA (Ecstasy)Ur Screen: NOT DETECTED
Methadone Scn, Ur: NOT DETECTED
Opiate, Ur Screen: NOT DETECTED
Phencyclidine (PCP) Ur S: NOT DETECTED
Tricyclic, Ur Screen: NOT DETECTED

## 2018-11-30 LAB — MAGNESIUM: Magnesium: 2.3 mg/dL (ref 1.7–2.4)

## 2018-11-30 LAB — TROPONIN I
Troponin I: 10.51 ng/mL (ref <0.03)
Troponin I: 5.73 ng/mL (ref <0.03)

## 2018-11-30 LAB — ECHOCARDIOGRAM COMPLETE
Height: 72 in
Weight: 4613.79 oz

## 2018-11-30 MED ORDER — ASPIRIN 81 MG PO CHEW: 81.0000 mg | CHEWABLE_TABLET | Freq: Every day | ORAL | Status: AC

## 2018-11-30 MED ORDER — METOPROLOL TARTRATE 25 MG PO TABS: 25.0000 mg | ORAL_TABLET | Freq: Two times a day (BID) | ORAL | Status: AC

## 2018-11-30 NOTE — Discharge Summary (Signed)
[facility] [place] NAME: Shane Savage    MR#:  [id number]  DATE OF BIRTH:  [birth year]  DATE OF ADMISSION:  11/29/2018 ADMITTING PHYSICIAN: Yolonda Kida, MD  DATE OF DISCHARGE: 11/30/2018  PRIMARY CARE PHYSICIAN: Patient, No Pcp Per    ADMISSION DIAGNOSIS:  V-tach ([place]) [I47.2]  DISCHARGE DIAGNOSIS:  Active Problems:   V-tach ([place])   STEMI (ST elevation myocardial infarction) ([place])   SECONDARY DIAGNOSIS:   Past Medical History:  Diagnosis Date  . Hypertension     HOSPITAL COURSE:   68 year old male with history of hypertension no prior cardiac history who presented to the emergency room after syncopal episode and was noted to be in ventricular tachycardia by EMS.  1.  V. tach: Patient was cardioverted to normal sinus rhythm.  He underwent emergent cardiac catheterization which revealed insignificant disease.  Ejection fraction was normal. Troponin has peaked at 10.51.  Cardiology has spoken with [facility] cardiology/EP and patient will need transfer to tertiary care unit for cardiac MRI and likely AICD for secondary prevention.  2.  Elevated troponin: This is due to cardioversion/shock.  Patient underwent cardiac catheterization which showed insignificant disease.  3.  Hypertension: Patient will continue on lisinopril/HCTZ metoprolol 4.  Syncope: This is due to V. tach   DISCHARGE CONDITIONS AND DIET:  Stable for discharge cardiac diet  CONSULTS OBTAINED:  Treatment Team:  Teodoro Spray, MD  DRUG ALLERGIES:   Allergies  Allergen Reactions  . Penicillins Rash    DISCHARGE MEDICATIONS:   Allergies as of 11/30/2018      Reactions   Penicillins Rash      Medication List    STOP taking these medications   naloxone 4 MG/0.1ML Liqd nasal spray kit Commonly known as:  NARCAN     TAKE these medications   allopurinol 100 MG tablet Commonly known as:  ZYLOPRIM Take 100 mg by mouth daily. Take one 100 mg tablet  by mouth daily along with 300 mg tablet   allopurinol 300 MG tablet Commonly known as:  ZYLOPRIM Take 300 mg by mouth daily. Take one 300 mg tablet by mouth daily along with 100 mg tablet   aspirin 81 MG chewable tablet Chew 1 tablet (81 mg total) by mouth daily.   atorvastatin 10 MG tablet Commonly known as:  LIPITOR Take 10 mg by mouth daily.   colchicine 0.6 MG tablet Take 0.6-1.2 mg by mouth as directed. Take 2 tablets by mouth at onset of pain, then take one tablet one hour later, then one tablet twice daily   gabapentin 300 MG capsule Commonly known as:  NEURONTIN Take 300 mg by mouth 2 (two) times daily.   lisinopril-hydrochlorothiazide 20-12.5 MG tablet Commonly known as:  PRINZIDE,ZESTORETIC Take 1 tablet by mouth daily.   metoprolol tartrate 25 MG tablet Commonly known as:  LOPRESSOR Take 1 tablet (25 mg total) by mouth 2 (two) times daily.   oxyCODONE-acetaminophen 10-325 MG tablet Commonly known as:  PERCOCET Take 1 tablet by mouth every 4 (four) hours as needed for pain. Maximum of 6 tablets daily         Today   CHIEF COMPLAINT:  Doing well without symptoms of chest pain or shortness of breath.  No dizziness or lightheadedness.   VITAL SIGNS:  Blood pressure (!) 113/94, pulse 69, temperature 97.9 F (36.6 C), temperature source Axillary, resp. rate (!) 25, height 6' (1.829 m), weight 130.8 kg, SpO2 99 %.  REVIEW OF SYSTEMS:  Review of Systems  Constitutional: Negative.  Negative for chills, fever and malaise/fatigue.  HENT: Negative.  Negative for ear discharge, ear pain, hearing loss, nosebleeds and sore throat.   Eyes: Negative.  Negative for blurred vision and pain.  Respiratory: Negative.  Negative for cough, hemoptysis, shortness of breath and wheezing.   Cardiovascular: Negative.  Negative for chest pain, palpitations and leg swelling.  Gastrointestinal: Negative.  Negative for abdominal pain, blood in stool, diarrhea, nausea and vomiting.   Genitourinary: Negative.  Negative for dysuria.  Musculoskeletal: Negative.  Negative for back pain.  Skin: Negative.   Neurological: Negative for dizziness, tremors, speech change, focal weakness, seizures and headaches.  Endo/Heme/Allergies: Negative.  Does not bruise/bleed easily.  Psychiatric/Behavioral: Negative.  Negative for depression, hallucinations and suicidal ideas.     PHYSICAL EXAMINATION:  GENERAL:  68 y.o.-year-old patient lying in the bed with no acute distress.  NECK:  Supple, no jugular venous distention. No thyroid enlargement, no tenderness.  LUNGS: Normal breath sounds bilaterally, no wheezing, rales,rhonchi  No use of accessory muscles of respiration.  CARDIOVASCULAR: S1, S2 normal. No murmurs, rubs, or gallops.  ABDOMEN: Soft, non-tender, non-distended. Bowel sounds present. No organomegaly or mass.  EXTREMITIES: No pedal edema, cyanosis, or clubbing.  PSYCHIATRIC: The patient is alert and oriented x 3.  SKIN: No obvious rash, lesion, or ulcer.   DATA REVIEW:   CBC Recent Labs  Lab 11/30/18 0434  WBC 11.1*  HGB 11.9*  HCT 37.2*  PLT 256    Chemistries  Recent Labs  Lab 11/29/18 1519 11/30/18 0434  NA 138 137  K 4.2 4.5  CL 106 107  CO2 17* 22  GLUCOSE 276* 110*  BUN 24* 26*  CREATININE 1.63* 1.47*  CALCIUM 9.0 8.4*  MG 1.5* 2.3  AST 29  --   ALT 19  --   ALKPHOS 73  --   BILITOT 0.9  --     Cardiac Enzymes Recent Labs  Lab 11/29/18 1709 11/29/18 2235 11/30/18 0434  TROPONINI 0.68* 10.33* 10.51*    Microbiology Results  '@[id number]' @  RADIOLOGY:  Ct Head Wo Contrast  Result Date: 11/29/2018 CLINICAL DATA:  Head trauma with loss of consciousness. EXAM: CT HEAD WITHOUT CONTRAST TECHNIQUE: Contiguous axial images were obtained from the base of the skull through the vertex without intravenous contrast. COMPARISON:  None. FINDINGS: Brain: There is no mass, hemorrhage or extra-axial collection. The size and configuration of the  ventricles and extra-axial CSF spaces are normal. There is hypoattenuation of the white matter, most commonly indicating chronic small vessel disease. Vascular: No abnormal hyperdensity of the major intracranial arteries or dural venous sinuses. No intracranial atherosclerosis. Skull: The visualized skull base, calvarium and extracranial soft tissues are normal. Sinuses/Orbits: No fluid levels or advanced mucosal thickening of the visualized paranasal sinuses. No mastoid or middle ear effusion. The orbits are normal. IMPRESSION: 1. No acute intracranial abnormality. 2. Findings of chronic small vessel disease. Electronically Signed   By: Ulyses Jarred M.D.   On: 11/29/2018 18:15   Dg Chest Portable 1 View  Result Date: 11/29/2018 CLINICAL DATA:  68 year old male with a history sudden onset chest pain and dizziness. V-tach EXAM: PORTABLE CHEST 1 VIEW COMPARISON:  None. FINDINGS: Cardiomediastinal silhouette borderline enlarged, likely associated with low lung volumes. Low lung volumes, accentuating the interstitial markings and central vasculature. No pneumothorax. No large confluent airspace disease, with coarsened interstitial markings. Blunting at the right costophrenic sulcus and left costophrenic sulcus. Defibrillator pads  projecting over the chest. IMPRESSION: Low lung volumes with likely basilar atelectasis and chronic changes. Small bilateral pleural effusions not excluded. Defibrillator pads projecting over the left chest. Electronically Signed   By: Corrie Mckusick D.O.   On: 11/29/2018 15:46      Allergies as of 11/30/2018      Reactions   Penicillins Rash      Medication List    STOP taking these medications   naloxone 4 MG/0.1ML Liqd nasal spray kit Commonly known as:  NARCAN     TAKE these medications   allopurinol 100 MG tablet Commonly known as:  ZYLOPRIM Take 100 mg by mouth daily. Take one 100 mg tablet by mouth daily along with 300 mg tablet   allopurinol 300 MG  tablet Commonly known as:  ZYLOPRIM Take 300 mg by mouth daily. Take one 300 mg tablet by mouth daily along with 100 mg tablet   aspirin 81 MG chewable tablet Chew 1 tablet (81 mg total) by mouth daily.   atorvastatin 10 MG tablet Commonly known as:  LIPITOR Take 10 mg by mouth daily.   colchicine 0.6 MG tablet Take 0.6-1.2 mg by mouth as directed. Take 2 tablets by mouth at onset of pain, then take one tablet one hour later, then one tablet twice daily   gabapentin 300 MG capsule Commonly known as:  NEURONTIN Take 300 mg by mouth 2 (two) times daily.   lisinopril-hydrochlorothiazide 20-12.5 MG tablet Commonly known as:  PRINZIDE,ZESTORETIC Take 1 tablet by mouth daily.   metoprolol tartrate 25 MG tablet Commonly known as:  LOPRESSOR Take 1 tablet (25 mg total) by mouth 2 (two) times daily.   oxyCODONE-acetaminophen 10-325 MG tablet Commonly known as:  PERCOCET Take 1 tablet by mouth every 4 (four) hours as needed for pain. Maximum of 6 tablets daily         Management plans discussed with the patient and he is in agreement. Stable for discharge   Patient should follow up with cardiology EP  CODE STATUS:     Code Status Orders  (From admission, onward)         Start     Ordered   11/29/18 1721  Full code  Continuous     11/29/18 1720        Code Status History    Date Active Date Inactive Code Status Order ID Comments User Context   11/29/2018 1649 11/29/2018 1720 Full Code [id number]  Yolonda Kida, MD Inpatient      TOTAL TIME TAKING CARE OF THIS PATIENT: 39 minutes.    Note: This dictation was prepared with Dragon dictation along with smaller phrase technology. Any transcriptional errors that result from this process are unintentional.  Bettey Costa M.D on 11/30/2018 at 10:12 AM  Between 7am to 6pm - Pager - [phone] After 6pm go to [email or web address] - password [email or web address] [facility]  Office  [phone]  CC: Primary care  physician; Patient, No Pcp Per

## 2018-11-30 NOTE — Progress Notes (Signed)
Patient Name: Shane SayresJoseph Arenz Sr. Date of Encounter: 11/30/2018  Hospital Problem List     Active Problems:   V-tach [facility](HCC)   STEMI (ST elevation myocardial infarction) [facility](HCC)    Patient Profile     68 year old male with history of hypertension no prior cardiac history who was admitted after suffering a syncopal episode and noted to be in ventricular tachycardia by EMS.  Cardioverted to sinus rhythm.  Cardiac cath revealed insignificant disease.  EF was normal.  EF by echo was normal.  No obvious structural abnormalities.  Troponin has peaked at 10.51.  Urine drug screen was positive for benzodiazepines only.  Patient is hemodynamically stable.  Will need transfer to tertiary care center for cardiac MRI and likely AICD for secondary prevention.  Discussed transfer with patient and his son.  Subjective   No complaints.  Inpatient Medications    . allopurinol  300 mg Oral Daily   And  . allopurinol  100 mg Oral Daily  . aspirin  81 mg Oral Daily  . atorvastatin  10 mg Oral Daily  . enoxaparin (LOVENOX) injection  40 mg Subcutaneous Q24H  . gabapentin  300 mg Oral BID  . hydrochlorothiazide  12.5 mg Oral Daily  . lisinopril  20 mg Oral Daily  . metoprolol tartrate  25 mg Oral BID  . sodium chloride flush  3 mL Intravenous Q12H    Vital Signs    Vitals:   11/30/18 0600 11/30/18 0700 11/30/18 0800 11/30/18 0900  BP: 124/75 114/63 118/69 (!) 113/94  Pulse: (!) 58 (!) 56 66 69  Resp: 19 15 (!) 24 (!) 25  Temp:   97.9 F (36.6 C)   TempSrc:   Axillary   SpO2: 95% 95% 97% 99%  Weight:      Height:        Intake/Output Summary (Last 24 hours) at 11/30/2018 0947 Last data filed at 11/30/2018 0500 Gross per 24 hour  Intake 1097.9 ml  Output 230 ml  Net 867.9 ml   Filed Weights   11/29/18 1536 11/29/18 1700  Weight: (!) 149.7 kg 130.8 kg    Physical Exam    GEN: Well nourished, well developed, in no acute distress.  HEENT: normal.  Neck: Supple, no JVD, carotid  bruits, or masses. Cardiac: RRR, no murmurs, rubs, or gallops. No clubbing, cyanosis, edema.  Radials/DP/PT 2+ and equal bilaterally.  Respiratory:  Respirations regular and unlabored, clear to auscultation bilaterally. GI: Soft, nontender, nondistended, BS + x 4. MS: no deformity or atrophy. Skin: warm and dry, no rash. Neuro:  Strength and sensation are intact. Psych: Normal affect.  Labs    CBC Recent Labs    11/29/18 1519 11/30/18 0434  WBC 18.2* 11.1*  NEUTROABS 11.7*  --   HGB 13.6 11.9*  HCT 41.4 37.2*  MCV 94.1 95.1  PLT 330 256   Basic Metabolic Panel Recent Labs    16/05/9603/14/20 1519 11/30/18 0434  NA 138 137  K 4.2 4.5  CL 106 107  CO2 17* 22  GLUCOSE 276* 110*  BUN 24* 26*  CREATININE 1.63* 1.47*  CALCIUM 9.0 8.4*  MG 1.5* 2.3   Liver Function Tests Recent Labs    11/29/18 1519  AST 29  ALT 19  ALKPHOS 73  BILITOT 0.9  PROT 7.2  ALBUMIN 3.8   Recent Labs    11/29/18 1519  LIPASE 37   Cardiac Enzymes Recent Labs    11/29/18 1709 11/29/18 2235 11/30/18 0434  TROPONINI  0.68* 10.33* 10.51*   BNP No results for input(s): BNP in the last 72 hours. D-Dimer No results for input(s): DDIMER in the last 72 hours. Hemoglobin A1C No results for input(s): HGBA1C in the last 72 hours. Fasting Lipid Panel No results for input(s): CHOL, HDL, LDLCALC, TRIG, CHOLHDL, LDLDIRECT in the last 72 hours. Thyroid Function Tests Recent Labs    11/29/18 1519  TSH 5.014*    Telemetry  Normal sinus rhythm  ECG    Initially V. tach currently in sinus rhythm with QT interval of 429 ms.  Radiology    Ct Head Wo Contrast  Result Date: 11/29/2018 CLINICAL DATA:  Head trauma with loss of consciousness. EXAM: CT HEAD WITHOUT CONTRAST TECHNIQUE: Contiguous axial images were obtained from the base of the skull through the vertex without intravenous contrast. COMPARISON:  None. FINDINGS: Brain: There is no mass, hemorrhage or extra-axial collection. The size and  configuration of the ventricles and extra-axial CSF spaces are normal. There is hypoattenuation of the white matter, most commonly indicating chronic small vessel disease. Vascular: No abnormal hyperdensity of the major intracranial arteries or dural venous sinuses. No intracranial atherosclerosis. Skull: The visualized skull base, calvarium and extracranial soft tissues are normal. Sinuses/Orbits: No fluid levels or advanced mucosal thickening of the visualized paranasal sinuses. No mastoid or middle ear effusion. The orbits are normal. IMPRESSION: 1. No acute intracranial abnormality. 2. Findings of chronic small vessel disease. Electronically Signed   By: Deatra Robinson M.D.   On: 11/29/2018 18:15   Dg Chest Portable 1 View  Result Date: 11/29/2018 CLINICAL DATA:  68 year old male with a history sudden onset chest pain and dizziness. V-tach EXAM: PORTABLE CHEST 1 VIEW COMPARISON:  None. FINDINGS: Cardiomediastinal silhouette borderline enlarged, likely associated with low lung volumes. Low lung volumes, accentuating the interstitial markings and central vasculature. No pneumothorax. No large confluent airspace disease, with coarsened interstitial markings. Blunting at the right costophrenic sulcus and left costophrenic sulcus. Defibrillator pads projecting over the chest. IMPRESSION: Low lung volumes with likely basilar atelectasis and chronic changes. Small bilateral pleural effusions not excluded. Defibrillator pads projecting over the left chest. Electronically Signed   By: Gilmer Mor D.O.   On: 11/29/2018 15:46    Assessment & Plan    69 year old male with history of hypertension treated as an outpatient with allopurinol, colchicine, gabapentin, oxycodone and lisinopril-hydrochlorothiazide who was admitted after a syncopal episode and noted to be in ventricular tachycardia.  Not respond to IV amiodarone was cardioverted back to sinus rhythm.  Due to abnormalities on electrocardiogram immediately  post cardioversion he underwent urgent cardiac catheterization which revealed no significant coronary disease.  EF was normal.  EF by echo was normal with no obvious wall motion abnormalities.  Electrolytes were normal.  Elevated troponin secondary to defibrillation as well as prolonged wide-complex tachycardia.  Currently on low-dose beta-blocker.  Have discussed with [facility] electrophysiology.  Will transfer to [facility] for consideration for cardiac MRI and AICD for secondary prevention.  Signed, Darlin Priestly Fath MD 11/30/2018, 9:47 AM  Pager: [phone]

## 2018-11-30 NOTE — Progress Notes (Signed)
Transportation set up with [facility], waiting for ETA currently. Spoke with Kara Dies from [place] for initiation of transfer. Report given to Amy RN. Trudee Kuster

## 2018-12-01 LAB — HIV ANTIBODY (ROUTINE TESTING W REFLEX): HIV Screen 4th Generation wRfx: NONREACTIVE

## 2020-10-29 IMAGING — CT CT HEAD WITHOUT CONTRAST
3 series · 15 of 47 positions shown, 18 images · non-contrast
Comparison: None.

CLINICAL DATA: Head trauma with loss of consciousness.

EXAM:
CT HEAD WITHOUT CONTRAST
TECHNIQUE: Contiguous axial images were obtained from the base of the skull
through the vertex without intravenous contrast.

[Series 3: head wo · axial · 0.44mm/px · z∈[-167,-42]mm · 9 of 31 slices shown, 12 images]
[im 3/31  brain]
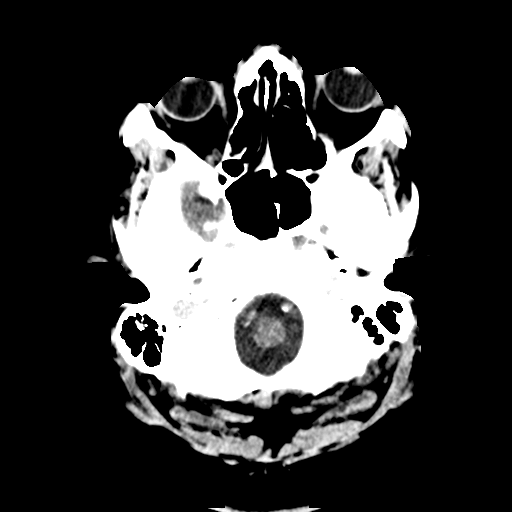
[im 3/31  bone]
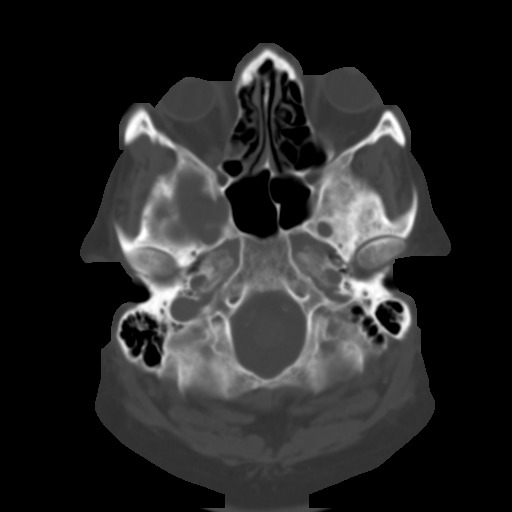
[im 6/31  brain]
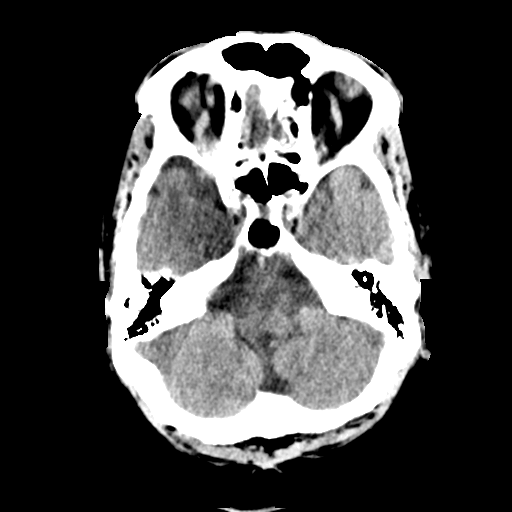
[im 9/31  brain]
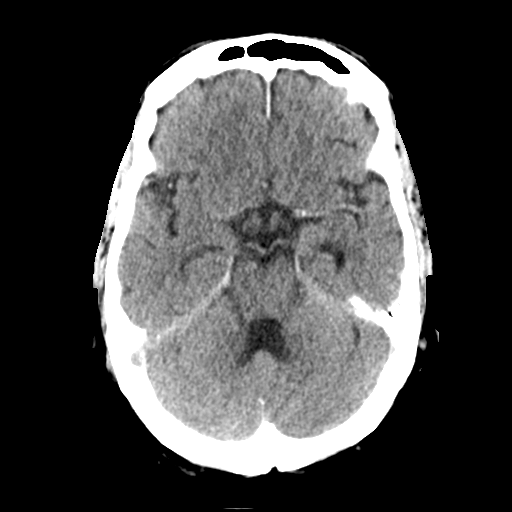
[im 12/31  brain]
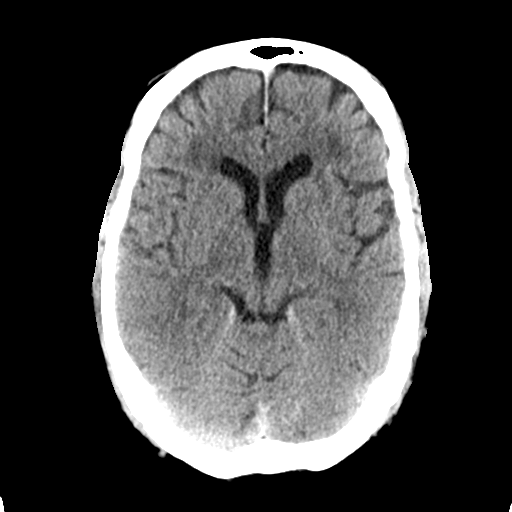
[im 16/31  brain]
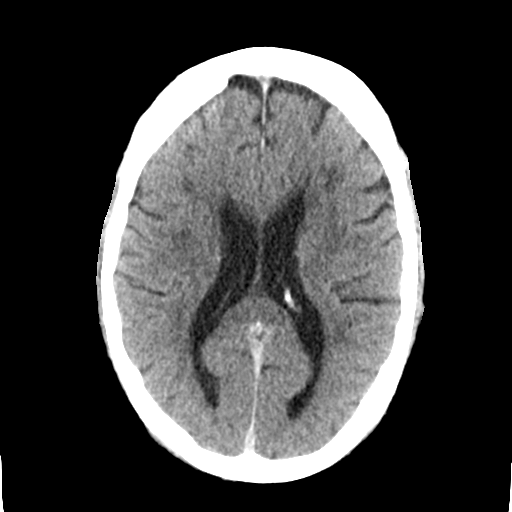
[im 16/31  bone]
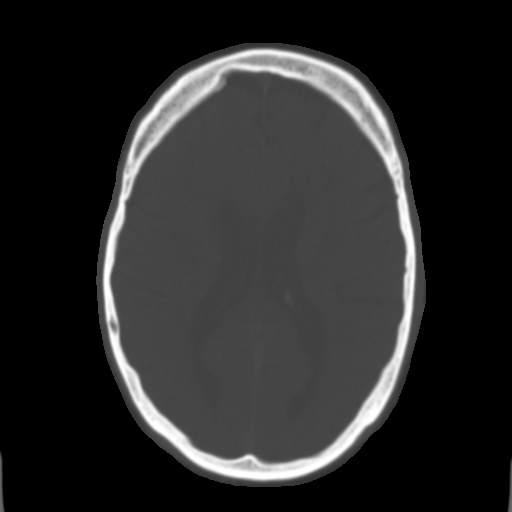
[im 19/31  brain]
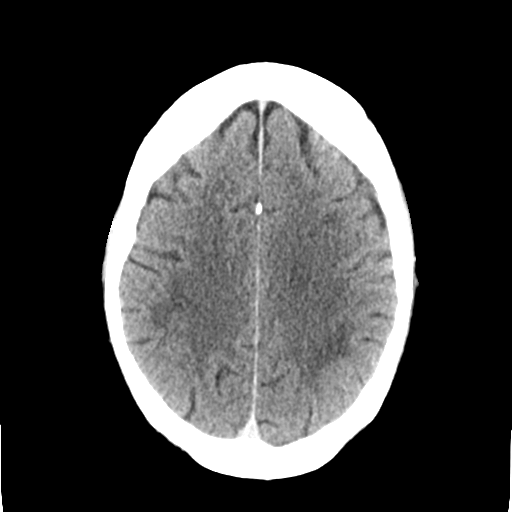
[im 22/31  brain]
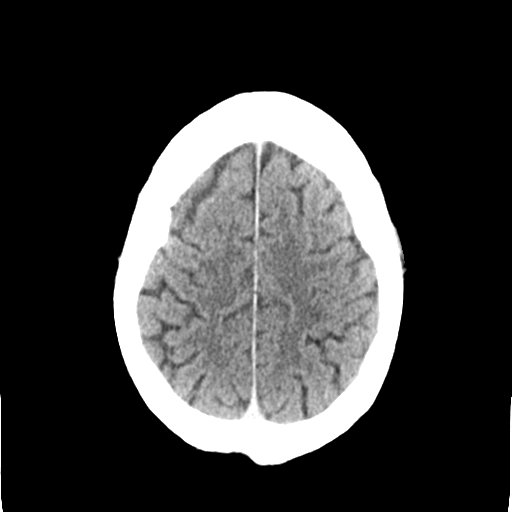
[im 25/31  brain]
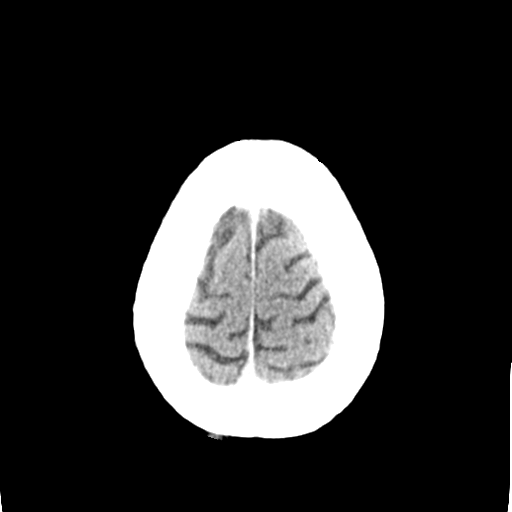
[im 28/31  brain]
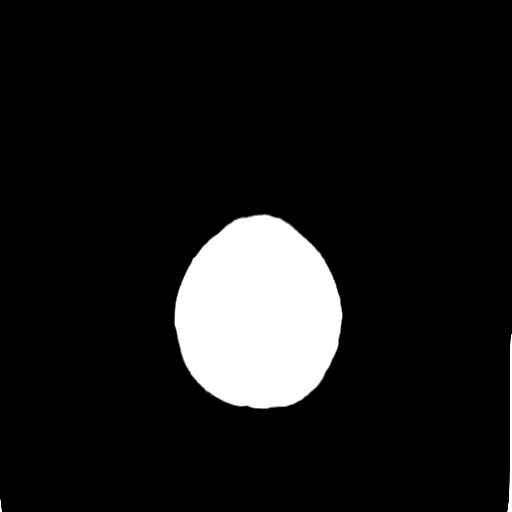
[im 28/31  bone]
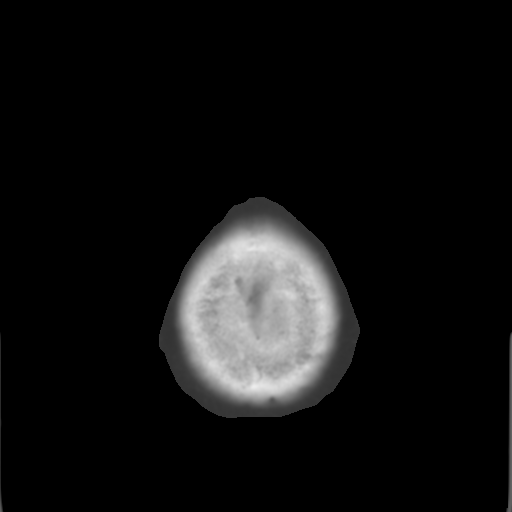

[Series 4: coronal soft tissue · coronal · 0.32mm/px · 3 of 70 slices shown]
[im 24/70  brain]
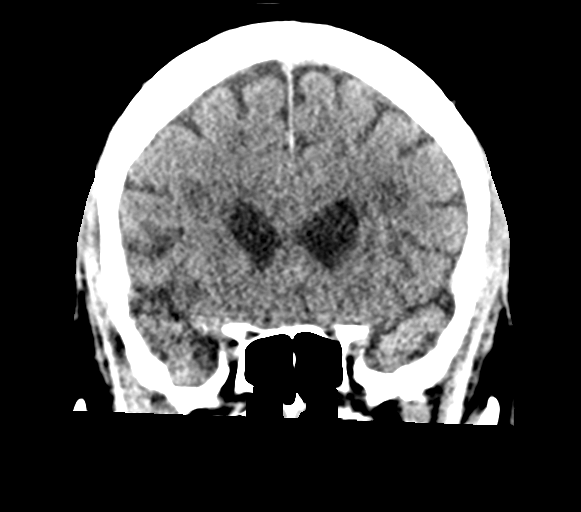
[im 31/70  brain]
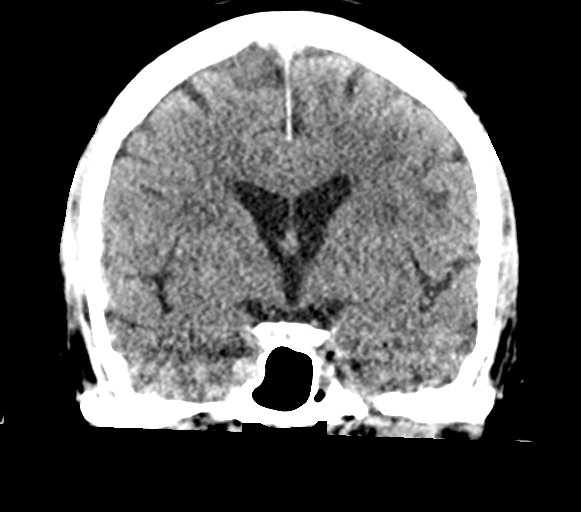
[im 39/70  brain]
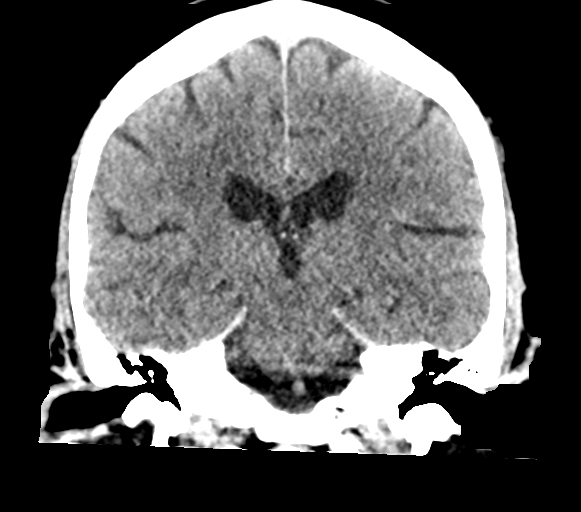

[Series 5: sagittal soft tissue · sagittal · 0.32mm/px · 3 of 55 slices shown]
[im 19/55  brain]
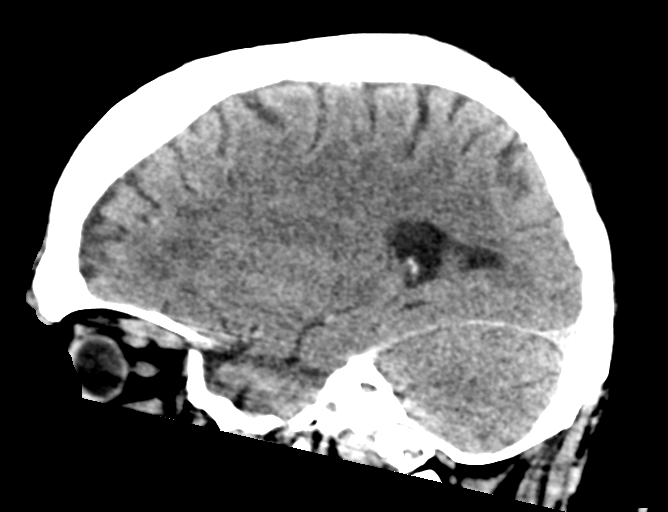
[im 28/55  brain]
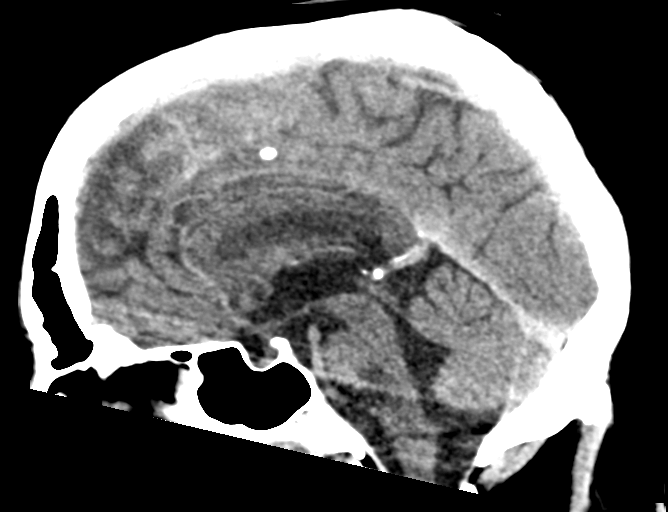
[im 37/55  brain]
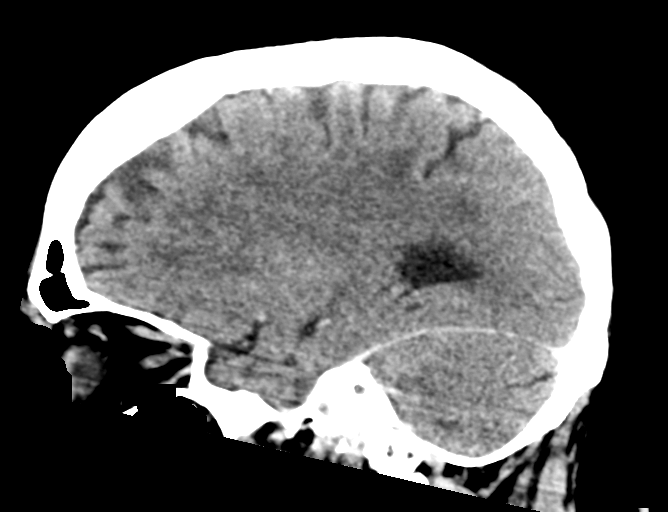

[15 of 47 positions shown; findings below may reference images not displayed]

FINDINGS: Brain: There is no mass, hemorrhage or extra-axial collection. The
size and configuration of the ventricles and extra-axial CSF spaces
are normal. There is hypoattenuation of the white matter, most
commonly indicating chronic small vessel disease.

Vascular: No abnormal hyperdensity of the major intracranial
arteries or dural venous sinuses. No intracranial atherosclerosis.

Skull: The visualized skull base, calvarium and extracranial soft
tissues are normal.

Sinuses/Orbits: No fluid levels or advanced mucosal thickening of
the visualized paranasal sinuses. No mastoid or middle ear effusion.
The orbits are normal.
IMPRESSION: 1. No acute intracranial abnormality.
2. Findings of chronic small vessel disease.

## 2020-12-23 ENCOUNTER — Ambulatory Visit
Admission: EM | Admit: 2020-12-23 | Discharge: 2020-12-23 | Disposition: A | Discharge status: Home / Self Care | Payer: Medicare Other | Attending: Emergency Medicine | Admitting: Emergency Medicine

## 2020-12-23 ENCOUNTER — Encounter: Payer: Self-pay | Admitting: Emergency Medicine

## 2020-12-23 ENCOUNTER — Other Ambulatory Visit: Payer: Self-pay

## 2020-12-23 DIAGNOSIS — R519 Headache, unspecified: Secondary | ICD-10-CM | POA: Diagnosis present

## 2020-12-23 DIAGNOSIS — Z20822 Contact with and (suspected) exposure to covid-19: Secondary | ICD-10-CM

## 2020-12-23 NOTE — Discharge Instructions (Signed)
Self isolate until covid results are back.  We will notify you by phone if it is positive. Your negative results will be sent through your MyChart.    If it is positive you need to isolate from others for a total of 5 days. If no fever for 24 hours without medications, and symptoms improving you may end isolation on day 6, but wear a mask if around any others for an additional 5 days.

## 2020-12-23 NOTE — ED Provider Notes (Signed)
[facility]    CSN: [id number] Arrival date & time: 12/23/20  1329      History   Chief Complaint Chief Complaint  Patient presents with  . Covid Exposure  . Headache    HPI Shane Wurzer Sr. is a 70 y.o. male.   Shane Sayres Sr. presents with complaints of sinus headache. He states this is not necessarily new for him but he was exposed to covid-19, so is concerned about this. No fevers. No cough or shortness of breath . He had a guest at his home approximately 9 days ago, who tested positive. His son, who resides in another wing of his home, tested positive today. History of stemi.     ROS per HPI, negative if not otherwise mentioned.      Past Medical History:  Diagnosis Date  . Hypertension     Patient Active Problem List   Diagnosis Date Noted  . V-tach (HCC) 11/29/2018  . STEMI (ST elevation myocardial infarction) (HCC) 11/29/2018    Past Surgical History:  Procedure Laterality Date  . LEFT HEART CATH AND CORONARY ANGIOGRAPHY N/A 11/29/2018   Procedure: LEFT HEART CATH AND CORONARY ANGIOGRAPHY;  Surgeon: Alwyn Pea, MD;  Location: [facility];  Service: Cardiovascular;  Laterality: N/A;  . NECK SURGERY         Home Medications    Prior to Admission medications   Medication Sig Start Date End Date Taking? Authorizing Provider  atorvastatin (LIPITOR) 10 MG tablet Take 10 mg by mouth daily. 10/19/18  Yes [provider]  lisinopril (ZESTRIL) 5 MG tablet Take 1 tablet by mouth daily. 10/09/20  Yes [provider]  magnesium oxide (MAG-OX) 400 (240 Mg) MG tablet Take by mouth. 10/07/20  Yes [provider]  metoprolol tartrate (LOPRESSOR) 25 MG tablet Take 1 tablet (25 mg total) by mouth 2 (two) times daily. 11/30/18  Yes Mody, Patricia Pesa, MD  mexiletine (MEXITIL) 150 MG capsule Take by mouth. 10/17/19  Yes [provider]  allopurinol (ZYLOPRIM) 100 MG tablet Take 100 mg by mouth daily. Take one 100 mg  tablet by mouth daily along with 300 mg tablet 07/19/17   [provider]  allopurinol (ZYLOPRIM) 300 MG tablet Take 300 mg by mouth daily. Take one 300 mg tablet by mouth daily along with 100 mg tablet 11/07/18   [provider]  aspirin 81 MG chewable tablet Chew 1 tablet (81 mg total) by mouth daily. 11/30/18   Adrian Saran, MD  colchicine 0.6 MG tablet Take 0.6-1.2 mg by mouth as directed. Take 2 tablets by mouth at onset of pain, then take one tablet one hour later, then one tablet twice daily 09/12/18   [provider]  gabapentin (NEURONTIN) 300 MG capsule Take 300 mg by mouth 2 (two) times daily. 08/15/18   [provider]  lisinopril-hydrochlorothiazide (PRINZIDE,ZESTORETIC) 20-12.5 MG tablet Take 1 tablet by mouth daily. 10/01/18   [provider]  oxyCODONE-acetaminophen (PERCOCET) 10-325 MG tablet Take 1 tablet by mouth every 4 (four) hours as needed for pain. Maximum of 6 tablets daily 08/20/18   [provider]    Family History History reviewed. No pertinent family history.  Social History Social History   Tobacco Use  . Smoking status: Never Smoker  . Smokeless tobacco: Never Used  Vaping Use  . Vaping Use: Never used  Substance Use Topics  . Alcohol use: Not Currently  . Drug use: Never     Allergies  Penicillins   Review of Systems Review of Systems   Physical Exam Triage Vital Signs ED Triage Vitals  Enc Vitals Group     BP 12/23/20 1400 127/81     Pulse Rate 12/23/20 1400 97     Resp 12/23/20 1400 18     Temp 12/23/20 1400 98.2 F (36.8 C)     Temp Source 12/23/20 1400 Oral     SpO2 12/23/20 1400 99 %     Weight 12/23/20 1356 288 lb 5.8 oz (130.8 kg)     Height 12/23/20 1356 6' (1.829 m)     Head Circumference --      Peak Flow --      Pain Score 12/23/20 1355 1     Pain Loc --      Pain Edu? --      Excl. in GC? --    No data found.  Updated Vital Signs BP 127/81 (BP Location: Left Arm)    Pulse 97   Temp 98.2 F (36.8 C) (Oral)   Resp 18   Ht 6' (1.829 m)   Wt 288 lb 5.8 oz (130.8 kg)   SpO2 99%   BMI 39.11 kg/m   Visual Acuity Right Eye Distance:   Left Eye Distance:   Bilateral Distance:    Right Eye Near:   Left Eye Near:    Bilateral Near:     Physical Exam Constitutional:      Appearance: He is well-developed.  Cardiovascular:     Rate and Rhythm: Normal rate.  Pulmonary:     Effort: Pulmonary effort is normal.  Skin:    General: Skin is warm and dry.  Neurological:     Mental Status: He is alert and oriented to person, place, and time.      UC Treatments / Results  Labs (all labs ordered are listed, but only abnormal results are displayed) Labs Reviewed  SARS CORONAVIRUS 2 (TAT 6-24 HRS)    EKG   Radiology No results found.  Procedures Procedures (including critical care time)  Medications Ordered in UC Medications - No data to display  Initial Impression / Assessment and Plan / UC Course  I have reviewed the triage vital signs and the nursing notes.  Pertinent labs & imaging results that were available during my care of the patient were reviewed by me and considered in my medical decision making (see chart for details).     Covid testing pending and isolation instructions provided.  Patient will qualify for paxlovid if tests positive. Supportive cares recommended. Return precautions provided. Patient verbalized understanding and agreeable to plan.   Final Clinical Impressions(s) / UC Diagnoses   Final diagnoses:  Exposure to COVID-19 virus  Encounter for laboratory testing for COVID-19 virus  Sinus headache     Discharge Instructions     Self isolate until covid results are back.  We will notify you by phone if it is positive. Your negative results will be sent through your MyChart.    If it is positive you need to isolate from others for a total of 5 days. If no fever for 24 hours without medications, and symptoms  improving you may end isolation on day 6, but wear a mask if around any others for an additional 5 days.     ED Prescriptions    None     PDMP not reviewed this encounter.   Georgetta Haber, NP 12/23/20 [phone]

## 2020-12-23 NOTE — ED Triage Notes (Signed)
Pt states he lives with his son who recently tested positive for covid. Pt states he has a "sinus headache". Denies fever.

## 2020-12-24 LAB — SARS CORONAVIRUS 2 (TAT 6-24 HRS): SARS Coronavirus 2: NEGATIVE

## 2020-12-27 ENCOUNTER — Other Ambulatory Visit: Payer: Self-pay

## 2020-12-27 ENCOUNTER — Encounter: Payer: Self-pay | Admitting: Emergency Medicine

## 2020-12-27 ENCOUNTER — Telehealth: Payer: Self-pay | Admitting: Sports Medicine

## 2020-12-27 ENCOUNTER — Ambulatory Visit
Admission: EM | Admit: 2020-12-27 | Discharge: 2020-12-27 | Disposition: A | Discharge status: Home / Self Care | Payer: Medicare Other | Attending: Sports Medicine | Admitting: Sports Medicine

## 2020-12-27 DIAGNOSIS — R519 Headache, unspecified: Secondary | ICD-10-CM

## 2020-12-27 DIAGNOSIS — R0981 Nasal congestion: Secondary | ICD-10-CM

## 2020-12-27 DIAGNOSIS — U071 COVID-19: Secondary | ICD-10-CM | POA: Diagnosis not present

## 2020-12-27 DIAGNOSIS — B349 Viral infection, unspecified: Secondary | ICD-10-CM

## 2020-12-27 DIAGNOSIS — M791 Myalgia, unspecified site: Secondary | ICD-10-CM | POA: Diagnosis not present

## 2020-12-27 DIAGNOSIS — R059 Cough, unspecified: Secondary | ICD-10-CM

## 2020-12-27 MED ORDER — MOLNUPIRAVIR EUA 200MG CAPSULE: 4.0000 | ORAL_CAPSULE | Freq: Two times a day (BID) | ORAL | 0 refills | Status: AC

## 2020-12-27 MED ORDER — PROMETHAZINE-DM 6.25-15 MG/5ML PO SYRP: 5.0000 mL | ORAL_SOLUTION | Freq: Four times a day (QID) | ORAL | 0 refills | Status: DC | PRN

## 2020-12-27 NOTE — Telephone Encounter (Signed)
Family requested COVID clinic referral mid

## 2020-12-27 NOTE — Discharge Instructions (Addendum)
As we discussed, given that he have positive sick contacts and have had 2 positive home test there is no need to retest you today.  Given your medical history you are a good candidate for the newer COVID-19 medication.  I sent that to your pharmacy. I also sent in a medication for your cough. As we discussed there is no need for an inhaler given that you are 100% on room air and he has no wheeze or significant chest tightness.  Given your cardiac history we want acute medication to a minimum. Please see educational handouts. Please quarantine for least 5 days per CDC guidelines. Plenty of rest, plenty of fluids, Tylenol or Motrin for any fever or discomfort. If you have any problems please go to the ER or call 911.

## 2020-12-27 NOTE — ED Provider Notes (Signed)
[facility]    CSN: [id number] Arrival date & time: 12/27/20  0800      History   Chief Complaint Chief Complaint  Patient presents with  . Cough  . Generalized Body Aches    HPI Shane Asman Sr. is a 70 y.o. male.   70 year old male who presents for evaluation of the above issue.  He reports he does not have a primary care physician as that person has recently moved practice.  He does have a significant cardiac history and has been followed by Dr. Mariel Kansky.  I have reviewed the chart in detail.  Patient reports that he has had several family members test positive for COVID.  In fact he actually moved out into a motel to try to avoid getting the illness.  He was actually seen here 4 days ago and had a negative test.  He started getting symptomatic over the past few days which included cough, nasal congestion, and some significant body aches.  He took 2 home test this morning and both were positive.  He comes in today hoping to initiate medical management.  He is only had one of the COVID vaccines as he had a heart attack about a week after that and he did not proceed with any future vaccines.  He does have some seasonal allergies.  As noted he has a significant cardiac history.  He is not a smoker, is not an asthmatic, and does not have COPD.  No significant shortness of breath or chest pain.  No red flag signs or symptoms elicited on history.     Past Medical History:  Diagnosis Date  . Hypertension     Patient Active Problem List   Diagnosis Date Noted  . V-tach (HCC) 11/29/2018  . STEMI (ST elevation myocardial infarction) (HCC) 11/29/2018    Past Surgical History:  Procedure Laterality Date  . LEFT HEART CATH AND CORONARY ANGIOGRAPHY N/A 11/29/2018   Procedure: LEFT HEART CATH AND CORONARY ANGIOGRAPHY;  Surgeon: Alwyn Pea, MD;  Location: [facility];  Service: Cardiovascular;  Laterality: N/A;  . NECK SURGERY         Home Medications     Prior to Admission medications   Medication Sig Start Date End Date Taking? Authorizing Provider  molnupiravir EUA 200 mg CAPS Take 4 capsules (800 mg total) by mouth 2 (two) times daily for 5 days. 12/27/20 01/01/21 Yes Delton See, MD  promethazine-dextromethorphan (PROMETHAZINE-DM) 6.25-15 MG/5ML syrup Take 5 mLs by mouth 4 (four) times daily as needed for cough. 12/27/20  Yes Delton See, MD  allopurinol (ZYLOPRIM) 100 MG tablet Take 100 mg by mouth daily. Take one 100 mg tablet by mouth daily along with 300 mg tablet 07/19/17   [provider]  allopurinol (ZYLOPRIM) 300 MG tablet Take 300 mg by mouth daily. Take one 300 mg tablet by mouth daily along with 100 mg tablet 11/07/18   [provider]  aspirin 81 MG chewable tablet Chew 1 tablet (81 mg total) by mouth daily. 11/30/18   Adrian Saran, MD  atorvastatin (LIPITOR) 10 MG tablet Take 10 mg by mouth daily. 10/19/18   [provider]  colchicine 0.6 MG tablet Take 0.6-1.2 mg by mouth as directed. Take 2 tablets by mouth at onset of pain, then take one tablet one hour later, then one tablet twice daily 09/12/18   [provider]  gabapentin (NEURONTIN) 300 MG capsule Take 300 mg by mouth 2 (two) times daily. 08/15/18  [provider]  lisinopril (ZESTRIL) 5 MG tablet Take 1 tablet by mouth daily. 10/09/20   [provider]  lisinopril-hydrochlorothiazide (PRINZIDE,ZESTORETIC) 20-12.5 MG tablet Take 1 tablet by mouth daily. 10/01/18   [provider]  magnesium oxide (MAG-OX) 400 (240 Mg) MG tablet Take by mouth. 10/07/20   [provider]  metoprolol tartrate (LOPRESSOR) 25 MG tablet Take 1 tablet (25 mg total) by mouth 2 (two) times daily. 11/30/18   Adrian SaranMody, Sital, MD  mexiletine (MEXITIL) 150 MG capsule Take by mouth. 10/17/19   [provider]  oxyCODONE-acetaminophen (PERCOCET) 10-325 MG tablet Take 1 tablet by mouth every 4 (four) hours as needed for pain. Maximum  of 6 tablets daily 08/20/18   [provider]    Family History History reviewed. No pertinent family history.  Social History Social History   Tobacco Use  . Smoking status: Never Smoker  . Smokeless tobacco: Never Used  Vaping Use  . Vaping Use: Never used  Substance Use Topics  . Alcohol use: Not Currently  . Drug use: Never     Allergies   Penicillins   Review of Systems Review of Systems  Constitutional: Negative for activity change, appetite change, chills, diaphoresis, fatigue and fever.  HENT: Positive for congestion. Negative for ear pain, postnasal drip, rhinorrhea, sinus pressure, sinus pain and sore throat.   Eyes: Negative.  Negative for pain.  Respiratory: Positive for cough. Negative for chest tightness, shortness of breath, wheezing and stridor.   Cardiovascular: Negative.  Negative for chest pain, palpitations and leg swelling.  Gastrointestinal: Negative for abdominal pain, diarrhea, nausea and vomiting.  Genitourinary: Negative.  Negative for dysuria.  Musculoskeletal: Positive for myalgias. Negative for arthralgias, back pain, gait problem, joint swelling, neck pain and neck stiffness.  Skin: Negative.  Negative for color change, pallor, rash and wound.  Neurological: Positive for headaches. Negative for dizziness, syncope, weakness and light-headedness.  Hematological: Negative.  Negative for adenopathy. Does not bruise/bleed easily.  All other systems reviewed and are negative.    Physical Exam Triage Vital Signs ED Triage Vitals  Enc Vitals Group     BP 12/27/20 0810 (!) 130/59     Pulse Rate 12/27/20 0810 72     Resp 12/27/20 0810 18     Temp 12/27/20 0810 98.7 F (37.1 C)     Temp Source 12/27/20 0810 Oral     SpO2 12/27/20 0810 100 %     Weight --      Height --      Head Circumference --      Peak Flow --      Pain Score 12/27/20 0809 8     Pain Loc --      Pain Edu? --      Excl. in GC? --    No data found.  Updated  Vital Signs BP (!) 130/59 (BP Location: Left Arm)   Pulse 72   Temp 98.7 F (37.1 C) (Oral)   Resp 18   SpO2 100%   Visual Acuity Right Eye Distance:   Left Eye Distance:   Bilateral Distance:    Right Eye Near:   Left Eye Near:    Bilateral Near:     Physical Exam Vitals and nursing note reviewed.  Constitutional:      General: He is not in acute distress.    Appearance: Normal appearance. He is not ill-appearing, toxic-appearing or diaphoretic.  HENT:     Head: Normocephalic and atraumatic.  Right Ear: Tympanic membrane normal.     Left Ear: Tympanic membrane normal.     Nose: Congestion present. No rhinorrhea.     Mouth/Throat:     Mouth: Mucous membranes are moist.     Pharynx: No oropharyngeal exudate or posterior oropharyngeal erythema.  Eyes:     General: No scleral icterus.       Right eye: No discharge.        Left eye: No discharge.     Extraocular Movements: Extraocular movements intact.     Conjunctiva/sclera: Conjunctivae normal.     Pupils: Pupils are equal, round, and reactive to light.  Cardiovascular:     Rate and Rhythm: Normal rate and regular rhythm.     Pulses: Normal pulses.     Heart sounds: Normal heart sounds. No murmur heard. No friction rub. No gallop.   Pulmonary:     Effort: Pulmonary effort is normal. No respiratory distress.     Breath sounds: Normal breath sounds. No stridor. No wheezing, rhonchi or rales.  Musculoskeletal:     Cervical back: Normal range of motion and neck supple. No rigidity or tenderness.  Lymphadenopathy:     Cervical: Cervical adenopathy present.  Skin:    General: Skin is warm and dry.     Capillary Refill: Capillary refill takes less than 2 seconds.     Findings: No bruising, erythema, lesion or rash.  Neurological:     General: No focal deficit present.     Mental Status: He is alert and oriented to person, place, and time.      UC Treatments / Results  Labs (all labs ordered are listed, but only  abnormal results are displayed) Labs Reviewed - No data to display  EKG   Radiology No results found.  Procedures Procedures (including critical care time)  Medications Ordered in UC Medications - No data to display  Initial Impression / Assessment and Plan / UC Course  I have reviewed the triage vital signs and the nursing notes.  Pertinent labs & imaging results that were available during my care of the patient were reviewed by me and considered in my medical decision making (see chart for details).   Clinical impression: 70 year old with a significant cardiac history presents with several days of URI symptoms including cough and congestion.  He did have positive sick contacts and COVID exposure.  He tested positive twice at home this morning.  He is symptomatic with significant myalgias.  His vitals and examination are reassuring.  Treatment plan: 1.  The findings and treatment plan were discussed in detail with the patient.  Patient was in agreement. 2.  Given his comorbidities and cardiac history I felt he would be a good candidate for molnupiravir.  Sent that to his pharmacy.  Also given the fact that he had an MRI shortly after the first COVID-vaccine we discussed the pros and cons of proceeding with this medication.  They were discussed in detail.  The patient would like to proceed and take this medication.  He understands the risks. 3.  Educational handouts provided. 4.  Plenty of rest, plenty fluids, Tylenol or Motrin for fever or discomfort. 5.  He needs to quarantine for least 5 days per the current CDC guidelines.  He should wear a mask and not leave the house. 6.  Also sent in a medication for cough to the pharmacy.  He had asked about an inhaler but he is not wheezing and has no shortness of breath  and is 100% on room air so no need for that right now. 7.  He does not have a primary care physician's office symptoms were to worsen he needs to go to the ER. 8.  He was  discharged in stable condition he will follow-up here as needed.    Final Clinical Impressions(s) / UC Diagnoses   Final diagnoses:  COVID  Cough  Nasal congestion  Myalgia  Headache due to viral infection     Discharge Instructions     As we discussed, given that he have positive sick contacts and have had 2 positive home test there is no need to retest you today.  Given your medical history you are a good candidate for the newer COVID-19 medication.  I sent that to your pharmacy. I also sent in a medication for your cough. As we discussed there is no need for an inhaler given that you are 100% on room air and he has no wheeze or significant chest tightness.  Given your cardiac history we want acute medication to a minimum. Please see educational handouts. Please quarantine for least 5 days per CDC guidelines. Plenty of rest, plenty of fluids, Tylenol or Motrin for any fever or discomfort. If you have any problems please go to the ER or call 911.    ED Prescriptions    Medication Sig Dispense Auth. Provider   molnupiravir EUA 200 mg CAPS Take 4 capsules (800 mg total) by mouth 2 (two) times daily for 5 days. 40 capsule Delton See, MD   promethazine-dextromethorphan (PROMETHAZINE-DM) 6.25-15 MG/5ML syrup Take 5 mLs by mouth 4 (four) times daily as needed for cough. 180 mL Delton See, MD     PDMP not reviewed this encounter.   Delton See, MD 12/27/20 [phone]

## 2020-12-27 NOTE — ED Triage Notes (Signed)
Pt is present today with body aches, HA, restless, and a slight cough. Pt states that he sx started yesterday. Pt states that several family members tested positive over the last week.

## 2022-07-14 ENCOUNTER — Ambulatory Visit
Admission: EM | Admit: 2022-07-14 | Discharge: 2022-07-14 | Disposition: A | Discharge status: Home / Self Care | Payer: Medicare Other | Attending: Emergency Medicine | Admitting: Emergency Medicine

## 2022-07-14 DIAGNOSIS — J0100 Acute maxillary sinusitis, unspecified: Secondary | ICD-10-CM

## 2022-07-14 DIAGNOSIS — R051 Acute cough: Secondary | ICD-10-CM

## 2022-07-14 MED ORDER — FLUTICASONE PROPIONATE 50 MCG/ACT NA SUSP: 2.0000 | Freq: Every day | NASAL | 0 refills | Status: AC

## 2022-07-14 MED ORDER — DOXYCYCLINE HYCLATE 100 MG PO CAPS: 100.0000 mg | ORAL_CAPSULE | Freq: Two times a day (BID) | ORAL | 0 refills | Status: AC

## 2022-07-14 MED ORDER — ALBUTEROL SULFATE HFA 108 (90 BASE) MCG/ACT IN AERS: 1.0000 | INHALATION_SPRAY | RESPIRATORY_TRACT | 0 refills | Status: AC | PRN

## 2022-07-14 MED ORDER — PROMETHAZINE-DM 6.25-15 MG/5ML PO SYRP: 5.0000 mL | ORAL_SOLUTION | Freq: Four times a day (QID) | ORAL | 0 refills | Status: AC | PRN

## 2022-07-14 NOTE — ED Triage Notes (Signed)
Patient reports about 6 days ago he started with a runny nose with yellow and green mucus and a productive cough.

## 2022-07-14 NOTE — Discharge Instructions (Addendum)
Continue Mucinex to keep the mucous thin and to decongest you.   Finish the doxycycline, even if you feel better.  2 puffs from your albuterol inhaler using your spacer every 4-6 hours as needed.  This will help open up your lungs and make your cough more productive.  Promethazine DM as needed for cough.  Use a NeilMed sinus rinse with distilled water as often as you want to to reduce nasal congestion. Follow the directions on the box.   Go to www.goodrx.com to look up your medications. This will give you a list of where you can find your prescriptions at the most affordable prices. Or you can ask the pharmacist what the cash price is. This is frequently cheaper than going through insurance.

## 2022-07-14 NOTE — ED Provider Notes (Signed)
HPI  SUBJECTIVE:  Shane Caras Sr. is a 71 y.o. male who presents with 4 days of sinus pain or pressure, nasal congestion, clear rhinorrhea, cough productive of dark yellow-green mucus for the past 2 days.  He is waking up at night coughing.  No postnasal drip, sore throat, fevers, facial swelling, upper dental pain, wheezing, chest pain, shortness of breath.  No body aches, headaches, loss of sense of smell or taste, nausea, vomiting, diarrhea, abdominal pain.  He has had 2 negative home COVID tests over 2 days.  No antibiotics in the past 3 months.  No antipyretic in the past 6 hours.  He has tried [employer] and his albuterol inhaler, and Mucinex.  The albuterol Mucinex have helped.  No aggravating factors.  He has a past medical history of STEMI status post stents but with a clean cardiac catheterization, V. tach status post pacemaker, remote history of asthma, gout.  No history of diabetes.denies history of hypertension.  PCP: None.   Past Medical History:  Diagnosis Date   Hypertension     Past Surgical History:  Procedure Laterality Date   LEFT HEART CATH AND CORONARY ANGIOGRAPHY N/A 11/29/2018   Procedure: LEFT HEART CATH AND CORONARY ANGIOGRAPHY;  Surgeon: Alwyn Pea, MD;  Location: [facility] IN[facility]IVE [facility];  Service: Cardiovascular;  Laterality: N/A;   NECK SURGERY      History reviewed. No pertinent family history.  Social History   Tobacco Use   Smoking status: Never   Smokeless tobacco: Never  Vaping Use   Vaping Use: Never used  Substance Use Topics   Alcohol use: Not Currently   Drug use: Never    No current facility-administered medications for this encounter.  Current Outpatient Medications:    albuterol (VENTOLIN HFA) 108 (90 Base) MCG/ACT inhaler, Inhale 1-2 puffs into the lungs every 4 (four) hours as needed for wheezing or shortness of breath., Disp: 1 each, Rfl: 0   doxycycline (VIBRAMYCIN) 100 MG capsule, Take 1 capsule (100 mg total) by mouth 2 (two)  times daily for 10 days., Disp: 20 capsule, Rfl: 0   fluticasone (FLONASE) 50 MCG/ACT nasal spray, Place 2 sprays into both nostrils daily., Disp: 16 g, Rfl: 0   promethazine-dextromethorphan (PROMETHAZINE-DM) 6.25-15 MG/5ML syrup, Take 5 mLs by mouth 4 (four) times daily as needed for cough., Disp: 118 mL, Rfl: 0   allopurinol (ZYLOPRIM) 100 MG tablet, Take 100 mg by mouth daily. Take one 100 mg tablet by mouth daily along with 300 mg tablet, Disp: , Rfl:    allopurinol (ZYLOPRIM) 300 MG tablet, Take 300 mg by mouth daily. Take one 300 mg tablet by mouth daily along with 100 mg tablet, Disp: , Rfl:    aspirin 81 MG chewable tablet, Chew 1 tablet (81 mg total) by mouth daily., Disp: , Rfl:    atorvastatin (LIPITOR) 10 MG tablet, Take 10 mg by mouth daily., Disp: , Rfl:    colchicine 0.6 MG tablet, Take 0.6-1.2 mg by mouth as directed. Take 2 tablets by mouth at onset of pain, then take one tablet one hour later, then one tablet twice daily, Disp: , Rfl:    gabapentin (NEURONTIN) 300 MG capsule, Take 300 mg by mouth 2 (two) times daily., Disp: , Rfl:    lisinopril (ZESTRIL) 5 MG tablet, Take 1 tablet by mouth daily., Disp: , Rfl:    lisinopril-hydrochlorothiazide (PRINZIDE,ZESTORETIC) 20-12.5 MG tablet, Take 1 tablet by mouth daily., Disp: , Rfl:    magnesium oxide (MAG-OX) 400 (240  Mg) MG tablet, Take by mouth., Disp: , Rfl:    metoprolol tartrate (LOPRESSOR) 25 MG tablet, Take 1 tablet (25 mg total) by mouth 2 (two) times daily., Disp: , Rfl:    mexiletine (MEXITIL) 150 MG capsule, Take by mouth., Disp: , Rfl:   Allergies  Allergen Reactions   Penicillins Rash     ROS  As noted in HPI.   Physical Exam  BP 119/66 (BP Location: Left Arm)   Pulse 83   Temp 97.8 F (36.6 C) (Oral)   Ht 6\' 1"  (1.854 m)   Wt 122.5 kg   SpO2 98%   BMI 35.62 kg/m   Constitutional: Well developed, well nourished, no acute distress Eyes:  EOMI, conjunctiva normal bilaterally HENT: Normocephalic,  atraumatic,mucus membranes moist.  Normal turbinates.  Purulent nasal congestion.  Positive maxillary sinus tenderness.  No postnasal drip. Respiratory: Normal inspiratory effort, questionable faint crackles base of right lung.  Good air movement.  Lungs otherwise clear. Cardiovascular: Normal rate, regular rhythm, no murmurs, rubs, gallops. GI: nondistended skin: No rash, skin intact Musculoskeletal: no deformities Neurologic: Alert & oriented x 3, no focal neuro deficits Psychiatric: Speech and behavior appropriate   ED Course   Medications - No data to display  Orders Placed This Encounter  Procedures   Nursing Communication Please set up with a PCP prior to discharge    Please set up with a PCP prior to discharge    Standing Status:   Standing    Number of Occurrences:   1    No results found for this or any previous visit (from the past 24 hour(s)). No results found.  ED Clinical Impression  1. Acute non-recurrent maxillary sinusitis   2. Acute cough      ED Assessment/Plan      I considered getting an x-ray to evaluate for pneumonia, however, it would not change management.  sending him home on doxycycline for 10 days to treat a sinus infection.  Did not do COVID testing as he has had 2 negative COVID tests at home over the course of 2 days.  This will also cover any possible pneumonia.  He is to continue Mucinex, start Flonase, will prescribe an albuterol inhaler.  He already has a spacer.  Advised him to continue using this every 4-6 hours.  Promethazine DM for the cough.  Will set up with a PCP prior to discharge.  Discussed MDM, treatment plan, and plan for follow-up with patient.. patient agrees with plan.   Meds ordered this encounter  Medications   doxycycline (VIBRAMYCIN) 100 MG capsule    Sig: Take 1 capsule (100 mg total) by mouth 2 (two) times daily for 10 days.    Dispense:  20 capsule    Refill:  0   fluticasone (FLONASE) 50 MCG/ACT nasal spray     Sig: Place 2 sprays into both nostrils daily.    Dispense:  16 g    Refill:  0   promethazine-dextromethorphan (PROMETHAZINE-DM) 6.25-15 MG/5ML syrup    Sig: Take 5 mLs by mouth 4 (four) times daily as needed for cough.    Dispense:  118 mL    Refill:  0   albuterol (VENTOLIN HFA) 108 (90 Base) MCG/ACT inhaler    Sig: Inhale 1-2 puffs into the lungs every 4 (four) hours as needed for wheezing or shortness of breath.    Dispense:  1 each    Refill:  0      *This clinic note was  created using [employer]. Therefore, there may be occasional mistakes despite careful proofreading.  ?    Domenick Gong, MD 07/15/22 2103

## 2022-10-12 ENCOUNTER — Other Ambulatory Visit: Payer: Self-pay | Admitting: Podiatry

## 2022-10-12 ENCOUNTER — Encounter: Payer: Self-pay | Admitting: Podiatry

## 2022-10-12 DIAGNOSIS — M1A0720 Idiopathic chronic gout, left ankle and foot, without tophus (tophi): Secondary | ICD-10-CM

## 2022-10-12 DIAGNOSIS — M19072 Primary osteoarthritis, left ankle and foot: Secondary | ICD-10-CM

## 2022-10-20 ENCOUNTER — Other Ambulatory Visit: Payer: Self-pay | Admitting: Podiatry

## 2022-10-20 ENCOUNTER — Ambulatory Visit
Admission: RE | Admit: 2022-10-20 | Discharge: 2022-10-20 | Disposition: A | Discharge status: Home / Self Care | Payer: Medicare Other | Source: Ambulatory Visit | Attending: Podiatry | Admitting: Podiatry

## 2022-10-20 DIAGNOSIS — M19072 Primary osteoarthritis, left ankle and foot: Secondary | ICD-10-CM | POA: Insufficient documentation

## 2022-10-20 DIAGNOSIS — M1A0720 Idiopathic chronic gout, left ankle and foot, without tophus (tophi): Secondary | ICD-10-CM
